# Patient Record
Sex: Male | Born: 1960 | Race: White | Hispanic: No | Marital: Married | Smoking: Never smoker
Health system: Southern US, Community
[De-identification: ages and names within clinical notes are randomized; demographics above are authoritative.]

## PROBLEM LIST (undated history)

## (undated) DIAGNOSIS — C801 Malignant (primary) neoplasm, unspecified: Secondary | ICD-10-CM

## (undated) DIAGNOSIS — J9819 Other pulmonary collapse: Secondary | ICD-10-CM

## (undated) DIAGNOSIS — K219 Gastro-esophageal reflux disease without esophagitis: Secondary | ICD-10-CM

## (undated) DIAGNOSIS — M720 Palmar fascial fibromatosis [Dupuytren]: Secondary | ICD-10-CM

## (undated) DIAGNOSIS — K1379 Other lesions of oral mucosa: Secondary | ICD-10-CM

---

## 1983-11-10 HISTORY — PX: KNEE ARTHROSCOPY: SUR90

## 1995-11-10 DIAGNOSIS — J9819 Other pulmonary collapse: Secondary | ICD-10-CM

## 1995-11-10 HISTORY — DX: Other pulmonary collapse: J98.19

## 2004-03-25 ENCOUNTER — Emergency Department (HOSPITAL_COMMUNITY): Admission: EM | Admit: 2004-03-25 | Discharge: 2004-03-25 | Payer: Self-pay | Admitting: Family Medicine

## 2004-05-30 ENCOUNTER — Encounter: Admission: RE | Admit: 2004-05-30 | Discharge: 2004-05-30 | Payer: Self-pay | Admitting: Orthopedic Surgery

## 2004-06-18 ENCOUNTER — Encounter: Admission: RE | Admit: 2004-06-18 | Discharge: 2004-06-18 | Payer: Self-pay | Admitting: Orthopedic Surgery

## 2004-07-08 ENCOUNTER — Encounter: Admission: RE | Admit: 2004-07-08 | Discharge: 2004-07-08 | Payer: Self-pay | Admitting: Orthopedic Surgery

## 2004-07-22 ENCOUNTER — Encounter: Admission: RE | Admit: 2004-07-22 | Discharge: 2004-07-22 | Payer: Self-pay | Admitting: Orthopedic Surgery

## 2004-09-02 ENCOUNTER — Ambulatory Visit (HOSPITAL_COMMUNITY): Admission: RE | Admit: 2004-09-02 | Discharge: 2004-09-02 | Payer: Self-pay | Admitting: Neurosurgery

## 2004-10-10 ENCOUNTER — Encounter: Admission: RE | Admit: 2004-10-10 | Discharge: 2004-10-10 | Payer: Self-pay | Admitting: General Surgery

## 2005-11-09 HISTORY — PX: OTHER SURGICAL HISTORY: SHX169

## 2008-08-11 ENCOUNTER — Emergency Department (HOSPITAL_BASED_OUTPATIENT_CLINIC_OR_DEPARTMENT_OTHER): Admission: EM | Admit: 2008-08-11 | Discharge: 2008-08-12 | Payer: Self-pay | Admitting: Emergency Medicine

## 2008-08-17 ENCOUNTER — Emergency Department (HOSPITAL_COMMUNITY): Admission: EM | Admit: 2008-08-17 | Discharge: 2008-08-17 | Payer: Self-pay | Admitting: Emergency Medicine

## 2008-11-20 ENCOUNTER — Ambulatory Visit (HOSPITAL_COMMUNITY): Admission: RE | Admit: 2008-11-20 | Discharge: 2008-11-20 | Payer: Self-pay | Admitting: Neurosurgery

## 2008-11-23 ENCOUNTER — Encounter: Admission: RE | Admit: 2008-11-23 | Discharge: 2008-11-23 | Payer: Self-pay | Admitting: Neurosurgery

## 2009-01-18 ENCOUNTER — Inpatient Hospital Stay (HOSPITAL_COMMUNITY): Admission: RE | Admit: 2009-01-18 | Discharge: 2009-01-21 | Payer: Self-pay | Admitting: Neurosurgery

## 2009-11-09 HISTORY — PX: LUMBAR FUSION: SHX111

## 2010-05-23 ENCOUNTER — Emergency Department (HOSPITAL_COMMUNITY): Admission: EM | Admit: 2010-05-23 | Discharge: 2010-05-23 | Payer: Self-pay | Admitting: Emergency Medicine

## 2011-01-24 LAB — BASIC METABOLIC PANEL
BUN: 12 mg/dL (ref 6–23)
CO2: 25 mEq/L (ref 19–32)
Calcium: 8.9 mg/dL (ref 8.4–10.5)
Chloride: 106 mEq/L (ref 96–112)
Creatinine, Ser: 1.27 mg/dL (ref 0.4–1.5)
GFR calc Af Amer: >60 mL/min (ref >60)
GFR calc non Af Amer: >60 mL/min (ref >60)
Glucose, Bld: 141 mg/dL — ABNORMAL HIGH (ref 70–99)
Potassium: 3.8 mEq/L (ref 3.5–5.1)
Sodium: 139 mEq/L (ref 135–145)

## 2011-01-24 LAB — CBC
HCT: 45.5 % (ref 39.0–52.0)
Hemoglobin: 16 g/dL (ref 13.0–17.0)
MCH: 31.3 pg (ref 26.0–34.0)
MCHC: 35.1 g/dL (ref 30.0–36.0)
MCV: 89.3 fL (ref 78.0–100.0)
Platelets: 175 10*3/uL (ref 150–400)
RBC: 5.1 MIL/uL (ref 4.22–5.81)
RDW: 13.3 % (ref 11.5–15.5)
WBC: 8.7 10*3/uL (ref 4.0–10.5)

## 2011-01-24 LAB — URINALYSIS, ROUTINE W REFLEX MICROSCOPIC
Bilirubin Urine: NEGATIVE
Glucose, UA: NEGATIVE mg/dL
Hgb urine dipstick: NEGATIVE
Ketones, ur: NEGATIVE mg/dL
Nitrite: NEGATIVE
Protein, ur: NEGATIVE mg/dL
Specific Gravity, Urine: 1.023 (ref 1.005–1.030)
Urobilinogen, UA: 0.2 mg/dL (ref 0.0–1.0)
pH: 6.5 (ref 5.0–8.0)

## 2011-01-24 LAB — DIFFERENTIAL
Basophils Absolute: 0 10*3/uL (ref 0.0–0.1)
Basophils Relative: 0 % (ref 0–1)
Eosinophils Absolute: 0.1 10*3/uL (ref 0.0–0.7)
Eosinophils Relative: 1 % (ref 0–5)
Lymphocytes Relative: 9 % — ABNORMAL LOW (ref 12–46)
Lymphs Abs: 0.8 10*3/uL (ref 0.7–4.0)
Monocytes Absolute: 0.4 10*3/uL (ref 0.1–1.0)
Monocytes Relative: 4 % (ref 3–12)
Neutro Abs: 7.5 10*3/uL (ref 1.7–7.7)
Neutrophils Relative %: 86 % — ABNORMAL HIGH (ref 43–77)

## 2011-02-19 LAB — CBC
HCT: 44.7 % (ref 39.0–52.0)
Hemoglobin: 15.7 g/dL (ref 13.0–17.0)
MCHC: 35 g/dL (ref 30.0–36.0)
MCV: 90.2 fL (ref 78.0–100.0)
Platelets: 209 10*3/uL (ref 150–400)
RBC: 4.96 MIL/uL (ref 4.22–5.81)
RDW: 13.4 % (ref 11.5–15.5)
WBC: 7.9 10*3/uL (ref 4.0–10.5)

## 2011-02-19 LAB — COMPREHENSIVE METABOLIC PANEL
ALT: 31 U/L (ref 0–53)
Albumin: 4 g/dL (ref 3.5–5.2)
Calcium: 9.4 mg/dL (ref 8.4–10.5)
GFR calc Af Amer: >60 mL/min (ref >60)
Glucose, Bld: 97 mg/dL (ref 70–99)
Sodium: 140 mEq/L (ref 135–145)
Total Protein: 6.8 g/dL (ref 6.0–8.3)

## 2011-02-19 LAB — DIFFERENTIAL
Eosinophils Absolute: 0.1 10*3/uL (ref 0.0–0.7)
Lymphs Abs: 1.4 10*3/uL (ref 0.7–4.0)
Monocytes Relative: 9 % (ref 3–12)
Neutrophils Relative %: 71 % (ref 43–77)

## 2011-02-19 LAB — TYPE AND SCREEN
ABO/RH(D): A POS
Antibody Screen: NEGATIVE

## 2011-02-19 LAB — APTT: aPTT: 28 seconds (ref 24–37)

## 2011-02-19 LAB — PROTIME-INR: INR: 1 (ref 0.00–1.49)

## 2011-02-19 LAB — URINALYSIS, ROUTINE W REFLEX MICROSCOPIC
Hgb urine dipstick: NEGATIVE
Nitrite: NEGATIVE
Specific Gravity, Urine: 1.027 (ref 1.005–1.030)
Urobilinogen, UA: 1 mg/dL (ref 0.0–1.0)

## 2011-03-24 NOTE — Op Note (Signed)
[name], Douglas             ACCOUNT NO.:  [id number]   MEDICAL RECORD NO.:  [id number]          PATIENT TYPE:  INP   LOCATION:  3034                         FACILITY:  [facility]   PHYSICIAN:  Payton Doughty, M.D.      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   OPERATIVE PROCEDURE:  L3-4 extreme lateral interbody fusion.   ANESTHESIA:  General endotracheal.   PREPARATION:  Prepped and draped with alcohol wipe.   COMPLICATIONS:  None.   NURSE ASSISTANT:  Bedelia Person, MD   DOCTOR ASSISTANT:  Hilda Lias, M.D.   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  Taken to the operating room, smoothly anesthetized, intubated, and  placed in the left side up and right side down lateral decubitus  position and prepared for EMG monitoring.  Following shave, prepped and  draped in the usual sterile fashion after C-arm localization, skin was  incised directly lateral from the C3-4 disk space and then slightly  posterior to that, using the 2 incision techniques, retroperitoneal  space was accessed and the probing wand was placed down to the  iliopsoas, for which he tested positive and the monitoring of the lumbar  plexus was carried out with advancing dilators over the L3-L4 disk  space.  Finally, the retractor was seated, the Shim placed and the  retractor opened.  C-arm fluoroscopy demonstrated good placement of  retractor in 2 planes.  Diskectomy was carried out and lateral release  was obtained.  A 10 x 55-mm interbody device filled with bone putty was  tapped into place after preparing the endplates.  Screws were placed and  lateral plate was attached to them and locked down.  Fluoroscopy  demonstrated good placement of interbody device as well as screws.  Incisions were  closed with 0 Vicryl, 2-0 Vicryl, and 3-0 nylon.  Betadine and Telfa dressing was applied and made occlusive with OpSite,  and the patient returned to recovery room in good condition.           ______________________________  Payton Doughty, M.D.     MWR/MEDQ  D:  01/18/2009  T:  01/19/2009  Job:  [id number]
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
## 2011-11-10 HISTORY — PX: MOHS SURGERY: SHX181

## 2012-05-07 ENCOUNTER — Emergency Department (HOSPITAL_COMMUNITY): Payer: BC Managed Care – PPO

## 2012-05-07 ENCOUNTER — Encounter (HOSPITAL_COMMUNITY): Payer: Self-pay | Admitting: *Deleted

## 2012-05-07 ENCOUNTER — Emergency Department (INDEPENDENT_AMBULATORY_CARE_PROVIDER_SITE_OTHER): Payer: BC Managed Care – PPO

## 2012-05-07 ENCOUNTER — Emergency Department (HOSPITAL_COMMUNITY)
Admission: EM | Admit: 2012-05-07 | Discharge: 2012-05-07 | Disposition: A | Discharge status: Nursing Home (Includes ICF) | Payer: BC Managed Care – PPO | Source: Home / Self Care | Attending: Emergency Medicine | Admitting: Emergency Medicine

## 2012-05-07 ENCOUNTER — Emergency Department (HOSPITAL_COMMUNITY)
Admission: EM | Admit: 2012-05-07 | Discharge: 2012-05-08 | Disposition: A | Discharge status: Home / Self Care | Payer: BC Managed Care – PPO | Attending: Emergency Medicine | Admitting: Emergency Medicine

## 2012-05-07 DIAGNOSIS — N200 Calculus of kidney: Secondary | ICD-10-CM

## 2012-05-07 DIAGNOSIS — D72829 Elevated white blood cell count, unspecified: Secondary | ICD-10-CM | POA: Insufficient documentation

## 2012-05-07 DIAGNOSIS — R109 Unspecified abdominal pain: Secondary | ICD-10-CM | POA: Insufficient documentation

## 2012-05-07 DIAGNOSIS — G8929 Other chronic pain: Secondary | ICD-10-CM | POA: Insufficient documentation

## 2012-05-07 DIAGNOSIS — M549 Dorsalgia, unspecified: Secondary | ICD-10-CM | POA: Insufficient documentation

## 2012-05-07 DIAGNOSIS — N10 Acute tubulo-interstitial nephritis: Secondary | ICD-10-CM

## 2012-05-07 LAB — COMPREHENSIVE METABOLIC PANEL
Albumin: 4 g/dL (ref 3.5–5.2)
Alkaline Phosphatase: 82 U/L (ref 39–117)
BUN: 12 mg/dL (ref 6–23)
Calcium: 9.2 mg/dL (ref 8.4–10.5)
Potassium: 4.5 mEq/L (ref 3.5–5.1)
Sodium: 142 mEq/L (ref 135–145)
Total Protein: 6.4 g/dL (ref 6.0–8.3)

## 2012-05-07 LAB — CBC WITH DIFFERENTIAL/PLATELET
Basophils Relative: 0 % (ref 0–1)
Eosinophils Absolute: 0.1 10*3/uL (ref 0.0–0.7)
MCH: 30.4 pg (ref 26.0–34.0)
MCHC: 35.1 g/dL (ref 30.0–36.0)
Monocytes Relative: 4 % (ref 3–12)
Neutrophils Relative %: 88 % — ABNORMAL HIGH (ref 43–77)
Platelets: 207 10*3/uL (ref 150–400)

## 2012-05-07 LAB — LIPASE, BLOOD: Lipase: 33 U/L (ref 11–59)

## 2012-05-07 LAB — POCT URINALYSIS DIP (DEVICE)
Hgb urine dipstick: NEGATIVE
Nitrite: NEGATIVE
Urobilinogen, UA: 1 mg/dL (ref 0.0–1.0)
pH: 7 (ref 5.0–8.0)

## 2012-05-07 MED ORDER — ONDANSETRON HCL 4 MG/2ML IJ SOLN
INTRAMUSCULAR | Status: AC
  Filled 2012-05-07: qty 2

## 2012-05-07 MED ORDER — KETOROLAC TROMETHAMINE 60 MG/2ML IM SOLN
60.0000 mg | Freq: Once | INTRAMUSCULAR | Status: AC
  Administered 2012-05-07: 60 mg via INTRAMUSCULAR

## 2012-05-07 MED ORDER — HYDROMORPHONE HCL PF 1 MG/ML IJ SOLN
INTRAMUSCULAR | Status: AC
  Filled 2012-05-07: qty 2

## 2012-05-07 MED ORDER — KETOROLAC TROMETHAMINE 60 MG/2ML IM SOLN
INTRAMUSCULAR | Status: AC
  Filled 2012-05-07: qty 2

## 2012-05-07 MED ORDER — HYDROMORPHONE HCL PF 1 MG/ML IJ SOLN
2.0000 mg | Freq: Once | INTRAMUSCULAR | Status: AC
  Administered 2012-05-07: 2 mg via INTRAMUSCULAR

## 2012-05-07 MED ORDER — HYDROMORPHONE HCL PF 1 MG/ML IJ SOLN
1.0000 mg | Freq: Once | INTRAMUSCULAR | Status: AC
  Administered 2012-05-07: 1 mg via INTRAVENOUS
  Filled 2012-05-07: qty 1

## 2012-05-07 MED ORDER — SODIUM CHLORIDE 0.9 % IV BOLUS (SEPSIS)
1000.0000 mL | Freq: Once | INTRAVENOUS | Status: AC
  Administered 2012-05-07: 1000 mL via INTRAVENOUS

## 2012-05-07 MED ORDER — ONDANSETRON HCL 4 MG/2ML IJ SOLN
4.0000 mg | Freq: Once | INTRAMUSCULAR | Status: AC
  Administered 2012-05-07: 4 mg via INTRAVENOUS
  Filled 2012-05-07: qty 2

## 2012-05-07 MED ORDER — ONDANSETRON HCL 4 MG/2ML IJ SOLN
4.0000 mg | Freq: Once | INTRAMUSCULAR | Status: AC
  Administered 2012-05-07: 4 mg via INTRAMUSCULAR

## 2012-05-07 NOTE — ED Notes (Addendum)
Pt with onset of left lower back pain radiating to left lower abd - denies urinary symptoms - left lower back tender to palpation - c/o nausea denies vomiting - pt with history back surgery - has implanted bone stimulator left buttock

## 2012-05-07 NOTE — ED Provider Notes (Signed)
Chief Complaint  Patient presents with  . Abdominal Pain  . Back Pain  . Nausea    History of Present Illness:    Douglas [name] is a 51 year old male who experienced sudden onset of severe 10 over 10 left flank pain this morning. The pain got worse as the day progressed and radiated towards his left groin. This was sharp and associated with nausea, chills, and diaphoresis he did not have any fever, vomiting, diarrhea, or blood in the stool. He was somewhat constipated but took an enema without relief. He denies any urinary symptoms such as dysuria, frequency, urgency, or blood in the urine. He does have a history of a possible kidney stone many past years ago. No history of diverticulitis or kidney infections.  Review of Systems:  Other than noted above, the patient denies any of the following symptoms: Constitutional:  No fever, chills, fatigue, weight loss or anorexia. Lungs:  No cough or shortness of breath. Heart:  No chest pain, palpitations, syncope or edema.  No cardiac history. Abdomen:  No nausea, vomiting, hematememesis, melena, diarrhea, or hematochezia. GU:  No dysuria, frequency, urgency, or hematuria.  No testicular pain or swelling. Skin:  No rash or itching.  PMFSH:  Past medical history, family history, social history, meds, and allergies were reviewed along with nurse's notes.  No prior abdominal surgeries or history of GI problems.  No use of NSAIDs or aspirin.  No excessive  alcohol intake.  Physical Exam:   Vital signs:  BP 146/100  Pulse 80  Temp 98.2 F (36.8 C) (Oral)  Resp 20  SpO2 98% Gen:  Alert, oriented, in severe distress due to his pain. Lungs:  Breath sounds clear and equal bilaterally.  No wheezes, rales or rhonchi. Heart:  Regular rhythm.  No gallops or murmers.   Abdomen:  Abdomen was soft, flat, nondistended. No organomegaly or mass. He has mild pain to palpation in the left flank, left upper quadrant, and left lower quadrant without guarding or rebound.  Bowel sounds were not heard. Back: He has severe left CVA tenderness to percussion. Skin:  Clear, warm and dry.  No rash.  Labs:   Results for orders placed during the hospital encounter of 05/07/12  POCT URINALYSIS DIP (DEVICE)      Component Value Range   Glucose, UA NEGATIVE  NEGATIVE mg/dL   Bilirubin Urine NEGATIVE  NEGATIVE   Ketones, ur NEGATIVE  NEGATIVE mg/dL   Specific Gravity, Urine 1.020  1.005 - 1.030   Hgb urine dipstick NEGATIVE  NEGATIVE   pH 7.0  5.0 - 8.0   Protein, ur NEGATIVE  NEGATIVE mg/dL   Urobilinogen, UA 1.0  0.0 - 1.0 mg/dL   Nitrite NEGATIVE  NEGATIVE   Leukocytes, UA NEGATIVE  NEGATIVE  CBC WITH DIFFERENTIAL      Component Value Range   WBC 12.7 (*) 4.0 - 10.5 K/uL   RBC 4.90  4.22 - 5.81 MIL/uL   Hemoglobin 14.9  13.0 - 17.0 g/dL   HCT 40.9  81.1 - 91.4 %   MCV 86.5  78.0 - 100.0 fL   MCH 30.4  26.0 - 34.0 pg   MCHC 35.1  30.0 - 36.0 g/dL   RDW 78.2  95.6 - 21.3 %   Platelets 207  150 - 400 K/uL   Neutrophils Relative 88 (*) 43 - 77 %   Neutro Abs 11.1 (*) 1.7 - 7.7 K/uL   Lymphocytes Relative 8 (*) 12 - 46 %   Lymphs  Abs 1.1  0.7 - 4.0 K/uL   Monocytes Relative 4  3 - 12 %   Monocytes Absolute 0.5  0.1 - 1.0 K/uL   Eosinophils Relative 1  0 - 5 %   Eosinophils Absolute 0.1  0.0 - 0.7 K/uL   Basophils Relative 0  0 - 1 %   Basophils Absolute 0.0  0.0 - 0.1 K/uL     Radiology:  Dg Abd 1 View  05/07/2012  *RADIOLOGY REPORT*  Clinical Data: Left flank pain  ABDOMEN - 1 VIEW  Comparison: CT 05/23/2010  Findings: Gas pattern is unremarkable.  No evidence of urinary tract calcification.  There is a phlebolith in the left pelvis. Previous lateral fusion at L3-4.  Neurostimulator projects over the region.  IMPRESSION: No acute finding.  No evidence of urinary tract stone disease.  Original Report Authenticated By: Thomasenia Sales, M.D.   Course in Urgent Care Center:   He was given Dilaudid 2 mg IM, Zofran 4 mg IM, and Toradol 60 mg IM which he  tolerated well without any immediate side effects. He got a little bit of relief, but his pain was not gone.  Assessment:  The primary encounter diagnosis was Kidney stone. A diagnosis of Acute pyelonephritis was also pertinent to this visit. At this point the differential diagnosis would include kidney stone, kidney stone with infection, or acute pyelonephritis, or other infectious causes such as diverticulitis.  Plan:   1.  The following meds were prescribed:   New Prescriptions   No medications on file   2.  The patient was transported to the emergency department via shuttle.  Reuben Likes, MD 05/07/12 2028

## 2012-05-07 NOTE — ED Notes (Signed)
Pt states that this afternoon he had back pain at first then the pain wrapped around his left side. Pt from [facility]. Pt was given pain medications which have helped and has urine and blood work already resulted from urgent care.

## 2012-05-07 NOTE — Discharge Instructions (Signed)
We have determined that your problem requires further evaluation in the emergency department.  We will take care of your transport there.  Once at the emergency department, you will be evaluated by a provider and they will order whatever treatment or tests they deem necessary.  We cannot guarantee that they will do any specific test or do any specific treatment.    Kidney Stones Kidney stones (ureteral lithiasis) are deposits that form inside your kidneys. The intense pain is caused by the stone moving through the urinary tract. When the stone moves, the ureter goes into spasm around the stone. The stone is usually passed in the urine.  CAUSES   A disorder that makes certain neck glands produce too much parathyroid hormone (primary hyperparathyroidism).   A buildup of uric acid crystals.   Narrowing (stricture) of the ureter.   A kidney obstruction present at birth (congenital obstruction).   Previous surgery on the kidney or ureters.   Numerous kidney infections.  SYMPTOMS   Feeling sick to your stomach (nauseous).   Throwing up (vomiting).   Blood in the urine (hematuria).   Pain that usually spreads (radiates) to the groin.   Frequency or urgency of urination.  DIAGNOSIS   Taking a history and physical exam.   Blood or urine tests.   Computerized X-ray scan (CT scan).   Occasionally, an examination of the inside of the urinary bladder (cystoscopy) is performed.  TREATMENT   Observation.   Increasing your fluid intake.   Surgery may be needed if you have severe pain or persistent obstruction.  The size, location, and chemical composition are all important variables that will determine the proper choice of action for you. Talk to your caregiver to better understand your situation so that you will minimize the risk of injury to yourself and your kidney.  HOME CARE INSTRUCTIONS   Drink enough water and fluids to keep your urine clear or pale yellow.   Strain all urine  through the provided strainer. Keep all particulate matter and stones for your caregiver to see. The stone causing the pain may be as small as a grain of salt. It is very important to use the strainer each and every time you pass your urine. The collection of your stone will allow your caregiver to analyze it and verify that a stone has actually passed.   Only take over-the-counter or prescription medicines for pain, discomfort, or fever as directed by your caregiver.   Make a follow-up appointment with your caregiver as directed.   Get follow-up X-rays if required. The absence of pain does not always mean that the stone has passed. It may have only stopped moving. If the urine remains completely obstructed, it can cause loss of kidney function or even complete destruction of the kidney. It is your responsibility to make sure X-rays and follow-ups are completed. Ultrasounds of the kidney can show blockages and the status of the kidney. Ultrasounds are not associated with any radiation and can be performed easily in a matter of minutes.  SEEK IMMEDIATE MEDICAL CARE IF:   Pain cannot be controlled with the prescribed medicine.   You have a fever.   The severity or intensity of pain increases over 18 hours and is not relieved by pain medicine.   You develop a new onset of abdominal pain.   You feel faint or pass out.  MAKE SURE YOU:   Understand these instructions.   Will watch your condition.   Will get help  right away if you are not doing well or get worse.  Document Released: 10/26/2005 Document Revised: 10/15/2011 Document Reviewed: 02/21/2010 [facility] Patient Information 2012 [place], [place].

## 2012-05-07 NOTE — ED Provider Notes (Signed)
History     CSN: [id number]  Arrival date & time 05/07/12  2029   First MD Initiated Contact with Patient 05/07/12 2100      Chief Complaint  Patient presents with  . Abdominal Pain    (Consider location/radiation/quality/duration/timing/severity/associated sxs/prior treatment) HPI  Pw left flank pain since this morning. Rates 6/10 at this time, improved after pain medication at [facility] pta. +Nausea without vomiting. Reports mild constipation, last BM this morning, has been using enemas. No diarrhea. No blood in stool. Denies hematuria/dysuria/freq/urgency. Denies fever +chills. No h/o nephrolithiasis. Denies history of recent trauma/falls. Denies h/o malignancy, DM, immunocompromise  injection drug use, immunosuppression, indwelling urinary catheter, prolonged steroid use, skin or urinary tract infection. No numbness/tingling/weakness of extremities. Denies fever/chills. Denies saddle anesthesia, no urinary incontinence or retention.   No h/o abdominal surgeries. H/o chronic back pain s/p spinal cord stimulator- back pain at baseline   ED Notes, ED Provider Notes from 05/07/12 0000 to 05/07/12 20:38:41       Douglas Carmell Austria, RN 05/07/2012 20:36      Pt states that this afternoon he had back pain at first then the pain wrapped around his left side. Pt from [facility]. Pt was given pain medications which have helped and has urine and blood work already resulted from urgent care.     History reviewed. No pertinent past medical history.  Past Surgical History  Procedure Date  . Back surgery   . Lumbar fusion   . Implanted bone stimulator     History reviewed. No pertinent family history.  History  Substance Use Topics  . Smoking status: Never Smoker   . Smokeless tobacco: Not on file  . Alcohol Use: Yes    Review of Systems  All other systems reviewed and are negative.  except as noted HPI   Allergies  Review of patient's allergies indicates no known allergies.  Home  Medications   Current Outpatient Rx  Name Route Sig Dispense Refill  . OXYCODONE-ACETAMINOPHEN 5-325 MG PO TABS Oral Take 1 tablet by mouth every 4 (four) hours as needed for pain. 15 tablet 0    BP 129/85  Pulse 67  Temp 98.7 F (37.1 C) (Oral)  Resp 16  SpO2 98%  Physical Exam  Nursing note and vitals reviewed. Constitutional: He is oriented to person, place, and time. He appears well-developed and well-nourished. No distress.  HENT:  Head: Atraumatic.  Mouth/Throat: Oropharynx is clear and moist.  Eyes: Conjunctivae are normal. Pupils are equal, round, and reactive to light.  Neck: Neck supple.  Cardiovascular: Normal rate, regular rhythm, normal heart sounds and intact distal pulses.  Exam reveals no gallop and no friction rub.   No murmur heard. Pulmonary/Chest: Effort normal. No respiratory distress. He has no wheezes. He has no rales.  Abdominal: Soft. Bowel sounds are normal. There is tenderness. There is no rebound and no guarding.       Min LLQ ttp No CVAT  Genitourinary: Penis normal.       No scrotal ttp/erythema/swelling  Musculoskeletal: Normal range of motion. He exhibits no edema and no tenderness.  Neurological: He is alert and oriented to person, place, and time.  Skin: Skin is warm and dry.  Psychiatric: He has a normal mood and affect.    ED Course  Procedures (including critical care time)  Labs Reviewed  COMPREHENSIVE METABOLIC PANEL - Abnormal; Notable for the following:    Glucose, Bld 121 (*)     GFR calc non Af  Amer 77 (*)     GFR calc Af Amer 89 (*)     All other components within normal limits  LIPASE, BLOOD  LAB REPORT - SCANNED   Ct Abdomen Pelvis Wo Contrast  05/07/2012  *RADIOLOGY REPORT*  Clinical Data: Left flank pain  CT ABDOMEN AND PELVIS WITHOUT CONTRAST  Technique:  Multidetector CT imaging of the abdomen and pelvis was performed following the standard protocol without intravenous contrast.  Comparison: 05/23/2010  Findings: Mild  dependent atelectasis.  Normal heart size.  No pleural or pericardial effusion.  Organ abnormality/lesion detection is limited in the absence of intravenous contrast. Within this limitation, unremarkable liver, biliary system, spleen, pancreas, adrenal glands.  Symmetric renal size.  No hydronephrosis or hydroureter.  No urinary tract calculi identified.  No bowel obstruction.  No CT evidence for colitis.  Normal appendix.  No free intraperitoneal air or fluid.  No lymphadenopathy.  Nonspecific mild bladder wall thickening.  Mild prostatomegaly.  Left posterior subcutaneous battery pack with neurostimulator entering at the T12/L1 level and coursing superiorly. Postoperative changes at L3-4 post lateral fusion.  IMPRESSION: No hydronephrosis or urinary tract calculi identified.  Mild bladder wall thickening is nonspecific given incomplete distension.  Correlate with urinalysis.  Original Report Authenticated By: Waneta Martins, M.D.   Dg Abd 1 View  05/07/2012  *RADIOLOGY REPORT*  Clinical Data: Left flank pain  ABDOMEN - 1 VIEW  Comparison: CT 05/23/2010  Findings: Gas pattern is unremarkable.  No evidence of urinary tract calcification.  There is a phlebolith in the left pelvis. Previous lateral fusion at L3-4.  Neurostimulator projects over the region.  IMPRESSION: No acute finding.  No evidence of urinary tract stone disease.  Original Report Authenticated By: Thomasenia Sales, M.D.    1. Abdominal  pain, other specified site   2. Leukocytosis   3. Flank pain    MDM  Left flank pain. UA unremarkable from [facility]. CBC with mild leukocytosis. CT A/P without renal stone, no evidence for colitis on noncontrast scan. His chronic back pain is at baseline. Pain controlled in ED. Advised to f/u with PMD if sx persist. No EMC precluding discharge at this time. Given Precautions for return. PMD f/u.         Forbes Cellar, MD 05/08/12 1530

## 2012-05-08 MED ORDER — OXYCODONE-ACETAMINOPHEN 5-325 MG PO TABS: 1.0000 | ORAL_TABLET | ORAL | Status: AC | PRN

## 2012-05-08 MED ORDER — OXYCODONE-ACETAMINOPHEN 5-325 MG PO TABS
1.0000 | ORAL_TABLET | Freq: Once | ORAL | Status: AC
  Administered 2012-05-08: 1 via ORAL
  Filled 2012-05-08: qty 1

## 2012-05-08 NOTE — Discharge Instructions (Signed)
Abdominal Pain Abdominal pain can be caused by many things. Your caregiver decides the seriousness of your pain by an examination and possibly blood tests and X-rays. Many cases can be observed and treated at home. Most abdominal pain is not caused by a disease and will probably improve without treatment. However, in many cases, more time must pass before a clear cause of the pain can be found. Before that point, it may not be known if you need more testing, or if hospitalization or surgery is needed. HOME CARE INSTRUCTIONS   Do not take laxatives unless directed by your caregiver.   Take pain medicine only as directed by your caregiver.   Only take over-the-counter or prescription medicines for pain, discomfort, or fever as directed by your caregiver.   Try a clear liquid diet (broth, tea, or water) for as long as directed by your caregiver. Slowly move to a bland diet as tolerated.  SEEK IMMEDIATE MEDICAL CARE IF:   The pain does not go away.   You have a fever.   You keep throwing up (vomiting).   The pain is felt only in portions of the abdomen. Pain in the right side could possibly be appendicitis. In an adult, pain in the left lower portion of the abdomen could be colitis or diverticulitis.   You pass bloody or black tarry stools.  MAKE SURE YOU:   Understand these instructions.   Will watch your condition.   Will get help right away if you are not doing well or get worse.  Document Released: 08/05/2005 Document Revised: 10/15/2011 Document Reviewed: 06/13/2008 ExitCare Patient Information 2012 [employer].  RESOURCE GUIDE  Dental Problems  Patients with Medicaid: [facility]                     [facility] [address]                                           [address] Phone:  [phone]                                                   Phone:  [phone]  If unable to pay or uninsured, contact:  [facility] or [place] [facility] to become qualified for the adult dental clinic.  Chronic Pain Problems Contact  [facility]  [phone] Patients need to be referred by their primary care doctor.  Insufficient Money for Medicine Contact United Way:  call "211" or [facility] 271-5999.  No Primary Care Doctor Call [facility]  832-8000 Other agencies that provide inexpensive medical care    [facility]  [phone]    [facility]  [phone]    [facility]  [phone]    [facility] Clinic  [phone]    [facility]  [phone]    [facility]  [phone]  Psychological Services [facility]  [phone] [facility]  [phone] [facility]   800 853-5163 (emergency services 641-4993)  Abuse/Neglect [place] Child Abuse Hotline (336) 641-3795 [place] Child Abuse Hotline 800-378-5315 (After Hours)  Emergency Shelter  [facility] (336) 271-5985  Maternity Homes   [facility] (336) 275-9566 [facility] (704) 372-4663  MRSA Hotline #:   832-7006    [place] [facility]  [facility] [place]  [employer]                           [facility] [facility] [address] [place]                     [address]         [address]                                                 [place]                              [place] Phone:  [phone]                                  Phone:  [phone]                   Phone:  [phone]  [facility] Phone:  [phone]  [place] Child Abuse Hotline (336) 342-1394 (336) 342-3537 (After Hours)  

## 2014-02-21 ENCOUNTER — Other Ambulatory Visit: Payer: Self-pay | Admitting: Family Medicine

## 2014-02-21 DIAGNOSIS — M7989 Other specified soft tissue disorders: Secondary | ICD-10-CM

## 2014-02-22 ENCOUNTER — Ambulatory Visit
Admission: RE | Admit: 2014-02-22 | Discharge: 2014-02-22 | Disposition: A | Discharge status: Home / Self Care | Payer: BC Managed Care – PPO | Source: Ambulatory Visit | Attending: Family Medicine | Admitting: Family Medicine

## 2014-02-22 DIAGNOSIS — M7989 Other specified soft tissue disorders: Secondary | ICD-10-CM

## 2014-02-27 ENCOUNTER — Other Ambulatory Visit: Payer: Self-pay | Admitting: Family Medicine

## 2014-02-28 ENCOUNTER — Other Ambulatory Visit: Payer: Self-pay | Admitting: Family Medicine

## 2014-02-28 DIAGNOSIS — M7989 Other specified soft tissue disorders: Secondary | ICD-10-CM

## 2014-03-05 ENCOUNTER — Ambulatory Visit
Admission: RE | Admit: 2014-03-05 | Discharge: 2014-03-05 | Disposition: A | Discharge status: Home / Self Care | Payer: BC Managed Care – PPO | Source: Ambulatory Visit | Attending: Family Medicine | Admitting: Family Medicine

## 2014-03-05 DIAGNOSIS — M7989 Other specified soft tissue disorders: Secondary | ICD-10-CM

## 2014-03-05 MED ORDER — IOHEXOL 300 MG/ML  SOLN
75.0000 mL | Freq: Once | INTRAMUSCULAR | Status: AC | PRN
  Administered 2014-03-05: 75 mL via INTRAVENOUS

## 2014-03-08 ENCOUNTER — Other Ambulatory Visit: Payer: Self-pay | Admitting: Family Medicine

## 2014-03-08 DIAGNOSIS — E041 Nontoxic single thyroid nodule: Secondary | ICD-10-CM

## 2014-03-12 ENCOUNTER — Ambulatory Visit
Admission: RE | Admit: 2014-03-12 | Discharge: 2014-03-12 | Disposition: A | Discharge status: Home / Self Care | Payer: BC Managed Care – PPO | Source: Ambulatory Visit | Attending: Family Medicine | Admitting: Family Medicine

## 2014-03-12 DIAGNOSIS — E041 Nontoxic single thyroid nodule: Secondary | ICD-10-CM

## 2014-07-06 ENCOUNTER — Other Ambulatory Visit: Payer: Self-pay | Admitting: Internal Medicine

## 2014-07-06 DIAGNOSIS — E041 Nontoxic single thyroid nodule: Secondary | ICD-10-CM

## 2014-07-17 ENCOUNTER — Other Ambulatory Visit (HOSPITAL_COMMUNITY)
Admission: RE | Admit: 2014-07-17 | Discharge: 2014-07-17 | Disposition: A | Discharge status: Home / Self Care | Payer: BC Managed Care – PPO | Source: Ambulatory Visit | Attending: Interventional Radiology | Admitting: Interventional Radiology

## 2014-07-17 ENCOUNTER — Ambulatory Visit
Admission: RE | Admit: 2014-07-17 | Discharge: 2014-07-17 | Disposition: A | Discharge status: Home / Self Care | Payer: BC Managed Care – PPO | Source: Ambulatory Visit | Attending: Internal Medicine | Admitting: Internal Medicine

## 2014-07-17 DIAGNOSIS — E041 Nontoxic single thyroid nodule: Secondary | ICD-10-CM

## 2014-07-17 DIAGNOSIS — E079 Disorder of thyroid, unspecified: Secondary | ICD-10-CM | POA: Insufficient documentation

## 2014-08-07 ENCOUNTER — Other Ambulatory Visit (INDEPENDENT_AMBULATORY_CARE_PROVIDER_SITE_OTHER): Payer: Self-pay

## 2014-08-09 ENCOUNTER — Other Ambulatory Visit (INDEPENDENT_AMBULATORY_CARE_PROVIDER_SITE_OTHER): Payer: Self-pay | Admitting: Surgery

## 2014-08-17 ENCOUNTER — Other Ambulatory Visit (HOSPITAL_COMMUNITY): Payer: Self-pay | Admitting: Anesthesiology

## 2014-08-17 ENCOUNTER — Encounter (HOSPITAL_COMMUNITY): Payer: Self-pay | Admitting: Pharmacy Technician

## 2014-08-17 NOTE — Progress Notes (Signed)
Chest ct 03-05-14 epic

## 2014-08-17 NOTE — Patient Instructions (Addendum)
[place]  08/17/2014   Your procedure is scheduled on: Thursday October 15th, 2015  Report to [facility] and follow signs to  [place] at 1030 AM.  Call this number if you have problems the morning of surgery [phone]   Remember:  Do not eat food or drink liquids :After Midnight.                               You may not have any metal on your body including hair pins and piercings  Do not wear jewelry, make-up, lotions, powders, or deodorant.   Men may shave face and neck.  Do not bring valuables to the hospital. [place].  Contacts, dentures or bridgework may not be worn into surgery.  Leave suitcase in the car. After surgery it may be brought to your room.   ________________________________________________________________________  [facility] - Preparing for Surgery Before surgery, you can play an important role.  Because skin is not sterile, your skin needs to be as free of germs as possible.  You can reduce the number of germs on your skin by washing with CHG (chlorahexidine gluconate) soap before surgery.  CHG is an antiseptic cleaner which kills germs and bonds with the skin to continue killing germs even after washing. Please DO NOT use if you have an allergy to CHG or antibacterial soaps.  If your skin becomes reddened/irritated stop using the CHG and inform your nurse when you arrive at Short Stay. Do not shave (including legs and underarms) for at least 48 hours prior to the first CHG shower.  You may shave your face/neck. Please follow these instructions carefully:  1.  Shower with CHG Soap the night before surgery and the  morning of Surgery.  2.  If you choose to wash your hair, wash your hair first as usual with your  normal  shampoo.  3.  After you shampoo, rinse your hair and body thoroughly to remove the  shampoo.                            4.  Use CHG as you would any other  liquid soap.  You can apply chg directly  to the skin and wash                       Gently with a scrungie or clean washcloth.  5.  Apply the CHG Soap to your body ONLY FROM THE NECK DOWN.   Do not use on face/ open                           Wound or open sores. Avoid contact with eyes, ears mouth and genitals (private parts).                       Wash face,  Genitals (private parts) with your normal soap.             6.  Wash thoroughly, paying special attention to the area where your surgery  will be performed.  7.  Thoroughly rinse your body with warm water from the neck down.  8.  DO NOT shower/wash with your normal soap after using and rinsing off  the CHG Soap.  9.  Pat yourself dry with a clean towel.            10.  Wear clean pajamas.            11.  Place clean sheets on your bed the night of your first shower and do not  sleep with pets. Day of Surgery : Do not apply any lotions/deodorants the morning of surgery.  Please wear clean clothes to the hospital/surgery center.  FAILURE TO FOLLOW THESE INSTRUCTIONS MAY RESULT IN THE CANCELLATION OF YOUR SURGERY PATIENT SIGNATURE_________________________________  NURSE SIGNATURE__________________________________  ________________________________________________________________________

## 2014-08-20 ENCOUNTER — Encounter (INDEPENDENT_AMBULATORY_CARE_PROVIDER_SITE_OTHER): Payer: Self-pay

## 2014-08-20 ENCOUNTER — Encounter (HOSPITAL_COMMUNITY)
Admission: RE | Admit: 2014-08-20 | Discharge: 2014-08-20 | Disposition: A | Discharge status: Home / Self Care | Payer: BC Managed Care – PPO | Source: Ambulatory Visit | Attending: Surgery | Admitting: Surgery

## 2014-08-20 ENCOUNTER — Encounter (HOSPITAL_COMMUNITY): Payer: Self-pay

## 2014-08-20 DIAGNOSIS — Z85820 Personal history of malignant melanoma of skin: Secondary | ICD-10-CM | POA: Diagnosis not present

## 2014-08-20 DIAGNOSIS — C73 Malignant neoplasm of thyroid gland: Secondary | ICD-10-CM | POA: Diagnosis not present

## 2014-08-20 HISTORY — DX: Palmar fascial fibromatosis (dupuytren): M72.0

## 2014-08-20 HISTORY — DX: Other pulmonary collapse: J98.19

## 2014-08-20 HISTORY — DX: Gastro-esophageal reflux disease without esophagitis: K21.9

## 2014-08-20 LAB — CBC
HEMATOCRIT: 45 % (ref 39.0–52.0)
Hemoglobin: 15.7 g/dL (ref 13.0–17.0)
MCH: 30.4 pg (ref 26.0–34.0)
MCHC: 34.9 g/dL (ref 30.0–36.0)
MCV: 87.2 fL (ref 78.0–100.0)
PLATELETS: 183 10*3/uL (ref 150–400)
RBC: 5.16 MIL/uL (ref 4.22–5.81)
RDW: 13.1 % (ref 11.5–15.5)
WBC: 8.2 10*3/uL (ref 4.0–10.5)

## 2014-08-20 NOTE — Progress Notes (Signed)
Quick Note:  These results are acceptable for scheduled surgery.  Kalysta Kneisley M. Keelon Zurn, MD, FACS [facility] Office: [phone]   ______ 

## 2014-08-22 ENCOUNTER — Encounter (HOSPITAL_COMMUNITY): Payer: Self-pay | Admitting: Surgery

## 2014-08-22 DIAGNOSIS — D440 Neoplasm of uncertain behavior of thyroid gland: Secondary | ICD-10-CM | POA: Diagnosis present

## 2014-08-22 NOTE — Anesthesia Preprocedure Evaluation (Signed)
Anesthesia Evaluation  Patient identified by MRN, date of birth, ID band Patient awake    Reviewed: Allergy & Precautions, H&P , NPO status , Patient's Chart, lab work & pertinent test results  Airway Mallampati: II TM Distance: >3 FB Neck ROM: Full    Dental no notable dental hx.    Pulmonary neg pulmonary ROS,  breath sounds clear to auscultation  Pulmonary exam normal       Cardiovascular negative cardio ROS  Rhythm:Regular Rate:Normal     Neuro/Psych negative neurological ROS  negative psych ROS   GI/Hepatic Neg liver ROS, GERD-  Medicated and Controlled,  Endo/Other  negative endocrine ROS  Renal/GU negative Renal ROS  negative genitourinary   Musculoskeletal negative musculoskeletal ROS (+)   Abdominal   Peds negative pediatric ROS (+)  Hematology negative hematology ROS (+)   Anesthesia Other Findings   Reproductive/Obstetrics negative OB ROS                           Anesthesia Physical Anesthesia Plan  ASA: II  Anesthesia Plan: General   Post-op Pain Management:    Induction: Intravenous  Airway Management Planned: Oral ETT  Additional Equipment:   Intra-op Plan:   Post-operative Plan: Extubation in OR  Informed Consent: I have reviewed the patients History and Physical, chart, labs and discussed the procedure including the risks, benefits and alternatives for the proposed anesthesia with the patient or authorized representative who has indicated his/her understanding and acceptance.   Dental advisory given  Plan Discussed with: CRNA  Anesthesia Plan Comments:         Anesthesia Quick Evaluation

## 2014-08-22 NOTE — H&P (Signed)
General Surgery [facility]  Christena Deem. Baillie 08/09/2014 4:29 PM Location: [facility] Patient #: [id number] DOB: 1961-03-22 Married / Language: Cleophus Molt / Race: White Male  History of Present Illness [name] MD; 08/09/2014 5:46 PM) Patient words: thyroid f/u.  The patient is a 53 year old male who presents with a thyroid nodule. Patient is referred by Dr. Delrae Rend for evaluation of thyroid nodule with atypia.  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 Incidental finding was made of a thyroid nodule. In May 2015 the patient underwent a thyroid ultrasound showing a solitary solid nodule in the right thyroid lobe measuring 3.6 cm in size. Subsequent fine-needle aspiration biopsy was performed in September 2015. This shows a follicular lesion with cytologic atypia, class III, 3.6 cm in diameter. Patient is referred for consideration of right thyroidectomy for definitive diagnosis.  Patient has had no prior surgery on the head or neck. He has never been on thyroid medication. There is no family history of endocrine disease and specifically no history of endocrine neoplasms.  Patient describes mild compressive symptoms including chronic cough.   Other Problems Marjean Donna, CMA; 08/09/2014 4:29 PM) Back Pain Melanoma Thyroid Disease  Past Surgical History ([place]; 08/09/2014 4:29 PM) Knee Surgery Right. Spinal Surgery - Lower Back  Diagnostic Studies History Marjean Donna, CMA; 08/09/2014 4:29 PM) Colonoscopy 1-5 years ago  Allergies Marjean Donna, [place]; 08/09/2014 4:29 PM) No Known Drug Allergies10/11/2013  Medication History Marjean Donna, CMA; 08/09/2014 4:29 PM) No Current Medications  Social History Marjean Donna, [place]; 08/09/2014 4:29 PM) Alcohol use Occasional alcohol use. Caffeine use Carbonated beverages. No drug use Tobacco use Never smoker.  Family  History Marjean Donna, [place]; 08/09/2014 4:29 PM) Breast Cancer Family Members In General. Colon Polyps Mother. Melanoma Father.  Review of Systems ([place]; 08/09/2014 4:29 PM) General Present- Fatigue. Not Present- Appetite Loss, Chills, Fever, Night Sweats, Weight Gain and Weight Loss. Skin Not Present- Change in Wart/Mole, Dryness, Hives, Jaundice, New Lesions, Non-Healing Wounds, Rash and Ulcer. HEENT Present- Hoarseness. Not Present- Earache, Hearing Loss, Nose Bleed, Oral Ulcers, Ringing in the Ears, Seasonal Allergies, Sinus Pain, Sore Throat, Visual Disturbances, Wears glasses/contact lenses and Yellow Eyes. Respiratory Present- Chronic Cough, Difficulty Breathing and Wheezing. Not Present- Bloody sputum and Snoring. Breast Not Present- Breast Mass, Breast Pain, Nipple Discharge and Skin Changes. Cardiovascular Not Present- Chest Pain, Difficulty Breathing Lying Down, Leg Cramps, Palpitations, Rapid Heart Rate, Shortness of Breath and Swelling of Extremities. Gastrointestinal Present- Difficulty Swallowing and Indigestion. Not Present- Abdominal Pain, Bloating, Bloody Stool, Change in Bowel Habits, Chronic diarrhea, Constipation, Excessive gas, Gets full quickly at meals, Hemorrhoids, Nausea, Rectal Pain and Vomiting. Male Genitourinary Not Present- Blood in Urine, Change in Urinary Stream, Frequency, Impotence, Nocturia, Painful Urination, Urgency and Urine Leakage. Musculoskeletal Present- Back Pain, Joint Pain and Joint Stiffness. Not Present- Muscle Pain, Muscle Weakness and Swelling of Extremities. Neurological Present- Decreased Memory. Not Present- Fainting, Headaches, Numbness, Seizures, Tingling, Tremor, Trouble walking and Weakness. Psychiatric Present- Change in Sleep Pattern. Not Present- Anxiety, Bipolar, Depression, Fearful and Frequent crying. Endocrine Not Present- Cold Intolerance, Excessive Hunger, Hair Changes, Heat Intolerance, Hot flashes and New  Diabetes. Hematology Present- Gland problems. Not Present- Easy Bruising, Excessive bleeding, HIV and Persistent Infections.   Vitals (Sonya Bynum CMA; 08/09/2014 4:30 PM) 08/09/2014 4:30 PM Weight: 210 lb Height: 70in Body Surface Area: 2.17 m Body Mass Index:  30.13 kg/m Temp.: 30F(Temporal)  Pulse: 64 (Regular)  BP: 118/72 (Sitting, Left Arm, Standard)    Physical Exam [name] MD; 08/09/2014 5:47 PM) The physical exam findings are as follows: Note:General - appears comfortable, no distress; not diaphorectic  HEENT - normocephalic; sclerae clear, gaze conjugate; mucous membranes moist, dentition good; voice normal  Neck - asymmetric on extension; no palpable anterior or posterior cervical adenopathy; palpation of the left thyroid lobe shows no dominant nor discrete nodules; palpation of the right thyroid lobe shows an obvious smooth 3-4 cm nodule in the central portion of the lobe which is mobile with swallowing and nontender.  Chest - clear bilaterally with rhonchi, rales, or wheeze  Cor - regular rhythm with normal rate; no significant murmur  Ext - non-tender without significant edema or lymphedema  Neuro - grossly intact; no tremor    Assessment & Plan [name] MD; 08/09/2014 5:49 PM) NEOPLASM OF UNCERTAIN BEHAVIOR OF THYROID GLAND (D44.0) Current Plans  I had a lengthy discussion with the patient, his wife, and his mother. We reviewed all of the above information including his ultrasound and pathology report. I provided them with written literature to review at home.  I agree with Dr. Cindra Eves assessment to proceed with right thyroid lobectomy. This would provide for definitive tissue diagnosis and may improve his mild compressive symptoms. We discussed the risk and benefits of the procedure including the possibility of recurrent laryngeal nerve injury or injury to parathyroid glands. We discussed the possibility of malignancy, with the risk been  25-30%, and the potential need for completion thyroidectomy. We discussed the hospital stay and the postoperative recovery to be anticipated.  Patient would like to proceed with surgery in the near future. We will make arrangements at a time convenient for the patient.  The risks and benefits of the procedure have been discussed at length with the patient. The patient understands the proposed procedure, potential alternative treatments, and the course of recovery to be expected. All of the patient's questions have been answered at this time. The patient wishes to proceed with surgery.   Signed by [name], MD (08/09/2014 5:50 PM)

## 2014-08-23 ENCOUNTER — Ambulatory Visit (HOSPITAL_COMMUNITY): Payer: BC Managed Care – PPO | Admitting: Anesthesiology

## 2014-08-23 ENCOUNTER — Observation Stay (HOSPITAL_COMMUNITY)
Admission: RE | Admit: 2014-08-23 | Discharge: 2014-08-24 | Disposition: A | Discharge status: Home / Self Care | Payer: BC Managed Care – PPO | Source: Ambulatory Visit | Attending: Surgery | Admitting: Surgery

## 2014-08-23 ENCOUNTER — Encounter (HOSPITAL_COMMUNITY)
Admission: RE | Disposition: A | Discharge status: Home / Self Care | Payer: Self-pay | Source: Ambulatory Visit | Attending: Surgery

## 2014-08-23 ENCOUNTER — Encounter (HOSPITAL_COMMUNITY): Payer: BC Managed Care – PPO | Admitting: Anesthesiology

## 2014-08-23 ENCOUNTER — Encounter (HOSPITAL_COMMUNITY): Payer: Self-pay | Admitting: *Deleted

## 2014-08-23 DIAGNOSIS — C73 Malignant neoplasm of thyroid gland: Secondary | ICD-10-CM | POA: Diagnosis not present

## 2014-08-23 DIAGNOSIS — D440 Neoplasm of uncertain behavior of thyroid gland: Secondary | ICD-10-CM | POA: Diagnosis present

## 2014-08-23 DIAGNOSIS — Z85820 Personal history of malignant melanoma of skin: Secondary | ICD-10-CM | POA: Insufficient documentation

## 2014-08-23 HISTORY — PX: THYROID LOBECTOMY: SHX420

## 2014-08-23 SURGERY — LOBECTOMY, THYROID
Anesthesia: General | Site: Neck | Laterality: Right

## 2014-08-23 MED ORDER — DEXAMETHASONE SODIUM PHOSPHATE 10 MG/ML IJ SOLN
INTRAMUSCULAR | Status: DC | PRN
  Administered 2014-08-23: 10 mg via INTRAVENOUS

## 2014-08-23 MED ORDER — NEOSTIGMINE METHYLSULFATE 10 MG/10ML IV SOLN
INTRAVENOUS | Status: DC | PRN
  Administered 2014-08-23: 5 mg via INTRAVENOUS

## 2014-08-23 MED ORDER — FENTANYL CITRATE 0.05 MG/ML IJ SOLN
25.0000 ug | INTRAMUSCULAR | Status: DC | PRN
  Administered 2014-08-23 (×2): 50 ug via INTRAVENOUS

## 2014-08-23 MED ORDER — HYDROMORPHONE HCL 1 MG/ML IJ SOLN
INTRAMUSCULAR | Status: AC
  Filled 2014-08-23: qty 1

## 2014-08-23 MED ORDER — ONDANSETRON HCL 4 MG PO TABS: 4.0000 mg | ORAL_TABLET | Freq: Four times a day (QID) | ORAL | Status: DC | PRN

## 2014-08-23 MED ORDER — METOCLOPRAMIDE HCL 5 MG/ML IJ SOLN
INTRAMUSCULAR | Status: AC
  Filled 2014-08-23: qty 2

## 2014-08-23 MED ORDER — FENTANYL CITRATE 0.05 MG/ML IJ SOLN
INTRAMUSCULAR | Status: DC | PRN
  Administered 2014-08-23: 25 ug via INTRAVENOUS
  Administered 2014-08-23: 100 ug via INTRAVENOUS
  Administered 2014-08-23: 25 ug via INTRAVENOUS
  Administered 2014-08-23: 50 ug via INTRAVENOUS

## 2014-08-23 MED ORDER — LACTATED RINGERS IV SOLN: INTRAVENOUS | Status: DC

## 2014-08-23 MED ORDER — MIDAZOLAM HCL 5 MG/5ML IJ SOLN
INTRAMUSCULAR | Status: DC | PRN
  Administered 2014-08-23: 2 mg via INTRAVENOUS

## 2014-08-23 MED ORDER — PROPOFOL 10 MG/ML IV BOLUS
INTRAVENOUS | Status: AC
  Filled 2014-08-23: qty 20

## 2014-08-23 MED ORDER — METOCLOPRAMIDE HCL 5 MG/ML IJ SOLN
INTRAMUSCULAR | Status: DC | PRN
  Administered 2014-08-23: 10 mg via INTRAVENOUS

## 2014-08-23 MED ORDER — HYDROMORPHONE HCL 1 MG/ML IJ SOLN
0.2500 mg | INTRAMUSCULAR | Status: DC | PRN
  Administered 2014-08-23 (×2): 0.5 mg via INTRAVENOUS

## 2014-08-23 MED ORDER — DEXAMETHASONE SODIUM PHOSPHATE 10 MG/ML IJ SOLN
INTRAMUSCULAR | Status: AC
  Filled 2014-08-23: qty 1

## 2014-08-23 MED ORDER — PHENYLEPHRINE 40 MCG/ML (10ML) SYRINGE FOR IV PUSH (FOR BLOOD PRESSURE SUPPORT)
PREFILLED_SYRINGE | INTRAVENOUS | Status: AC
  Filled 2014-08-23: qty 10

## 2014-08-23 MED ORDER — ONDANSETRON HCL 4 MG/2ML IJ SOLN
INTRAMUSCULAR | Status: DC | PRN
  Administered 2014-08-23: 4 mg via INTRAVENOUS

## 2014-08-23 MED ORDER — MIDAZOLAM HCL 2 MG/2ML IJ SOLN
INTRAMUSCULAR | Status: AC
  Filled 2014-08-23: qty 2

## 2014-08-23 MED ORDER — CISATRACURIUM BESYLATE 20 MG/10ML IV SOLN
INTRAVENOUS | Status: AC
  Filled 2014-08-23: qty 10

## 2014-08-23 MED ORDER — KCL IN DEXTROSE-NACL 30-5-0.45 MEQ/L-%-% IV SOLN
INTRAVENOUS | Status: DC
  Administered 2014-08-23: 18:00:00 via INTRAVENOUS
  Filled 2014-08-23 (×2): qty 1000

## 2014-08-23 MED ORDER — SUCCINYLCHOLINE CHLORIDE 20 MG/ML IJ SOLN
INTRAMUSCULAR | Status: DC | PRN
  Administered 2014-08-23: 100 mg via INTRAVENOUS

## 2014-08-23 MED ORDER — GLYCOPYRROLATE 0.2 MG/ML IJ SOLN
INTRAMUSCULAR | Status: DC | PRN
  Administered 2014-08-23: .8 mg via INTRAVENOUS

## 2014-08-23 MED ORDER — ONDANSETRON HCL 4 MG/2ML IJ SOLN: 4.0000 mg | Freq: Four times a day (QID) | INTRAMUSCULAR | Status: DC | PRN

## 2014-08-23 MED ORDER — PHENYLEPHRINE HCL 10 MG/ML IJ SOLN
INTRAMUSCULAR | Status: DC | PRN
  Administered 2014-08-23 (×2): 160 ug via INTRAVENOUS

## 2014-08-23 MED ORDER — CEFAZOLIN SODIUM-DEXTROSE 2-3 GM-% IV SOLR
INTRAVENOUS | Status: AC
  Filled 2014-08-23: qty 50

## 2014-08-23 MED ORDER — ACETAMINOPHEN 325 MG PO TABS: 650.0000 mg | ORAL_TABLET | ORAL | Status: DC | PRN

## 2014-08-23 MED ORDER — 0.9 % SODIUM CHLORIDE (POUR BTL) OPTIME
TOPICAL | Status: DC | PRN
  Administered 2014-08-23: 1000 mL

## 2014-08-23 MED ORDER — ONDANSETRON HCL 4 MG/2ML IJ SOLN
INTRAMUSCULAR | Status: AC
  Filled 2014-08-23: qty 2

## 2014-08-23 MED ORDER — HYDROMORPHONE HCL 1 MG/ML IJ SOLN
0.2500 mg | INTRAMUSCULAR | Status: DC | PRN
  Administered 2014-08-23 (×4): 0.5 mg via INTRAVENOUS

## 2014-08-23 MED ORDER — HYDROMORPHONE HCL 1 MG/ML IJ SOLN: 1.0000 mg | INTRAMUSCULAR | Status: DC | PRN

## 2014-08-23 MED ORDER — PROMETHAZINE HCL 25 MG/ML IJ SOLN: 6.2500 mg | INTRAMUSCULAR | Status: DC | PRN

## 2014-08-23 MED ORDER — HYDROCODONE-ACETAMINOPHEN 5-325 MG PO TABS
1.0000 | ORAL_TABLET | ORAL | Status: DC | PRN
  Administered 2014-08-23: 1 via ORAL
  Administered 2014-08-23 – 2014-08-24 (×2): 2 via ORAL
  Filled 2014-08-23 (×2): qty 2
  Filled 2014-08-23: qty 1

## 2014-08-23 MED ORDER — CISATRACURIUM BESYLATE (PF) 10 MG/5ML IV SOLN
INTRAVENOUS | Status: DC | PRN
  Administered 2014-08-23: 7 mg via INTRAVENOUS
  Administered 2014-08-23: 2 mg via INTRAVENOUS

## 2014-08-23 MED ORDER — FENTANYL CITRATE 0.05 MG/ML IJ SOLN
INTRAMUSCULAR | Status: AC
  Filled 2014-08-23: qty 2

## 2014-08-23 MED ORDER — CEFAZOLIN SODIUM-DEXTROSE 2-3 GM-% IV SOLR
2.0000 g | INTRAVENOUS | Status: AC
  Administered 2014-08-23: 2 g via INTRAVENOUS

## 2014-08-23 MED ORDER — ACETAMINOPHEN 10 MG/ML IV SOLN
1000.0000 mg | Freq: Once | INTRAVENOUS | Status: AC
  Administered 2014-08-23: 1000 mg via INTRAVENOUS
  Filled 2014-08-23: qty 100

## 2014-08-23 MED ORDER — GLYCOPYRROLATE 0.2 MG/ML IJ SOLN
INTRAMUSCULAR | Status: AC
  Filled 2014-08-23: qty 4

## 2014-08-23 MED ORDER — FENTANYL CITRATE 0.05 MG/ML IJ SOLN
INTRAMUSCULAR | Status: AC
  Filled 2014-08-23: qty 5

## 2014-08-23 MED ORDER — LACTATED RINGERS IV SOLN
INTRAVENOUS | Status: DC | PRN
  Administered 2014-08-23 (×2): via INTRAVENOUS

## 2014-08-23 MED ORDER — INFLUENZA VAC SPLIT QUAD 0.5 ML IM SUSY
0.5000 mL | PREFILLED_SYRINGE | INTRAMUSCULAR | Status: AC
  Administered 2014-08-24: 0.5 mL via INTRAMUSCULAR
  Filled 2014-08-23 (×2): qty 0.5

## 2014-08-23 MED ORDER — PROPOFOL 10 MG/ML IV BOLUS
INTRAVENOUS | Status: DC | PRN
  Administered 2014-08-23: 150 mg via INTRAVENOUS

## 2014-08-23 MED ORDER — HYDROMORPHONE HCL 1 MG/ML IJ SOLN
INTRAMUSCULAR | Status: AC
Start: 2014-08-23 — End: 2014-08-24
  Filled 2014-08-23: qty 1

## 2014-08-23 SURGICAL SUPPLY — 39 items
APL SKNCLS STERI-STRIP NONHPOA (GAUZE/BANDAGES/DRESSINGS) ×1
ATTRACTOMAT 16X20 MAGNETIC DRP (DRAPES) ×2 IMPLANT
BENZOIN TINCTURE PRP APPL 2/3 (GAUZE/BANDAGES/DRESSINGS) ×2 IMPLANT
BLADE HEX COATED 2.75 (ELECTRODE) ×2 IMPLANT
BLADE SURG 15 STRL LF DISP TIS (BLADE) ×1 IMPLANT
BLADE SURG 15 STRL SS (BLADE) ×2
CANISTER SUCTION 2500CC (MISCELLANEOUS) ×1 IMPLANT
CHLORAPREP W/TINT 10.5 ML (MISCELLANEOUS) ×2 IMPLANT
CLIP TI MEDIUM 6 (CLIP) ×4 IMPLANT
CLIP TI WIDE RED SMALL 6 (CLIP) ×5 IMPLANT
DISSECTOR ROUND CHERRY 3/8 STR (MISCELLANEOUS) IMPLANT
DRAPE PED LAPAROTOMY (DRAPES) ×2 IMPLANT
DRESSING SURGICEL FIBRLLR 1X2 (HEMOSTASIS) ×1 IMPLANT
DRSG SURGICEL FIBRILLAR 1X2 (HEMOSTASIS) ×2
ELECT REM PT RETURN 9FT ADLT (ELECTROSURGICAL) ×2
ELECTRODE REM PT RTRN 9FT ADLT (ELECTROSURGICAL) ×1 IMPLANT
GAUZE SPONGE 4X4 12PLY STRL (GAUZE/BANDAGES/DRESSINGS) ×1 IMPLANT
GAUZE SPONGE 4X4 16PLY XRAY LF (GAUZE/BANDAGES/DRESSINGS) ×2 IMPLANT
GLOVE SURG ORTHO 8.0 STRL STRW (GLOVE) ×2 IMPLANT
GOWN STRL REUS W/TWL LRG LVL3 (GOWN DISPOSABLE) ×2 IMPLANT
GOWN STRL REUS W/TWL XL LVL3 (GOWN DISPOSABLE) ×4 IMPLANT
KIT BASIN OR (CUSTOM PROCEDURE TRAY) ×2 IMPLANT
MANIFOLD NEPTUNE II (INSTRUMENTS) ×1 IMPLANT
NS IRRIG 1000ML POUR BTL (IV SOLUTION) ×2 IMPLANT
PACK BASIC VI WITH GOWN DISP (CUSTOM PROCEDURE TRAY) ×2 IMPLANT
PENCIL BUTTON HOLSTER BLD 10FT (ELECTRODE) ×2 IMPLANT
SHEARS HARMONIC 9CM CVD (BLADE) ×2 IMPLANT
STAPLER VISISTAT 35W (STAPLE) ×2 IMPLANT
STRIP CLOSURE SKIN 1/2X4 (GAUZE/BANDAGES/DRESSINGS) ×2 IMPLANT
SUT MNCRL AB 4-0 PS2 18 (SUTURE) ×2 IMPLANT
SUT SILK 2 0 (SUTURE) ×2
SUT SILK 2-0 18XBRD TIE 12 (SUTURE) ×1 IMPLANT
SUT SILK 3 0 (SUTURE)
SUT SILK 3-0 18XBRD TIE 12 (SUTURE) IMPLANT
SUT VIC AB 3-0 SH 18 (SUTURE) ×2 IMPLANT
SYR BULB IRRIGATION 50ML (SYRINGE) ×2 IMPLANT
TAPE CLOTH SURG 4X10 WHT LF (GAUZE/BANDAGES/DRESSINGS) ×1 IMPLANT
TOWEL OR 17X26 10 PK STRL BLUE (TOWEL DISPOSABLE) ×2 IMPLANT
YANKAUER SUCT BULB TIP 10FT TU (MISCELLANEOUS) ×2 IMPLANT

## 2014-08-23 NOTE — Anesthesia Postprocedure Evaluation (Signed)
  Anesthesia Post-op Note  Patient: Douglas [name]  Procedure(s) Performed: Procedure(s) (LRB): RIGHT THYROID LOBECTOMY (Right)  Patient Location: PACU  Anesthesia Type: General  Level of Consciousness: awake and alert   Airway and Oxygen Therapy: Patient Spontanous Breathing  Post-op Pain: mild  Post-op Assessment: Post-op Vital signs reviewed, Patient's Cardiovascular Status Stable, Respiratory Function Stable, Patent Airway and No signs of Nausea or vomiting  Last Vitals:  Filed Vitals:   08/23/14 1415  BP: 157/92  Pulse: 66  Temp:   Resp: 15    Post-op Vital Signs: stable   Complications: No apparent anesthesia complications

## 2014-08-23 NOTE — Interval H&P Note (Signed)
History and Physical Interval Note:  08/23/2014 7:34 AM  Douglas [name]  has presented today for surgery, with the diagnosis of RIGHT THYROID NEOPLASM OF UNCERTAIN BEHAVIOR.  The various methods of treatment have been discussed with the patient and family. After consideration of risks, benefits and other options for treatment, the patient has consented to    Procedure(s): RIGHT THYROID LOBECTOMY (Right) as a surgical intervention .    The patient's history has been reviewed, patient examined, no change in status, stable for surgery.  I have reviewed the patient's chart and labs.  Questions were answered to the patient's satisfaction.    [name], MD, [facility] Office: [place]

## 2014-08-23 NOTE — Transfer of Care (Signed)
Immediate Anesthesia Transfer of Care Note  Patient: Douglas [name]  Procedure(s) Performed: Procedure(s): RIGHT THYROID LOBECTOMY (Right)  Patient Location: PACU  Anesthesia Type:General  Level of Consciousness: awake, sedated and patient cooperative  Airway & Oxygen Therapy: Patient Spontanous Breathing and Patient connected to face mask oxygen  Post-op Assessment: Report given to PACU RN and Post -op Vital signs reviewed and stable  Post vital signs: Reviewed and stable  Complications: No apparent anesthesia complications

## 2014-08-23 NOTE — Brief Op Note (Signed)
08/23/2014  1:55 PM  PATIENT:  Douglas [name]  53 y.o. male  PRE-OPERATIVE DIAGNOSIS:  RIGHT THYROID NEOPLASM OF UNCERTAIN BEHAVIOR  POST-OPERATIVE DIAGNOSIS:  RIGHT THYROID NEOPLASM OF UNCERTAIN BEHAVIOR  PROCEDURE:  Procedure(s): RIGHT THYROID LOBECTOMY (Right)  SURGEON:  Surgeon(s) and Role:    * Armandina Gemma, MD - Primary  ANESTHESIA:   general  EBL:  Total I/O In: 1000 [I.V.:1000] Out: -   BLOOD ADMINISTERED:none  DRAINS: none   LOCAL MEDICATIONS USED:  None   SPECIMEN:  Excision  DISPOSITION OF SPECIMEN:  PATHOLOGY  COUNTS:  YES   TOURNIQUET:  * No tourniquets in log *  DICTATION: .Other Dictation: Dictation Number [id number]  PLAN OF CARE: Admit for overnight observation  PATIENT DISPOSITION:  PACU - hemodynamically stable.   Delay start of Pharmacological VTE agent (>24hrs) due to surgical blood loss or risk of bleeding: yes  [name], MD, [facility] Office: [phone]

## 2014-08-23 NOTE — Op Note (Signed)
NAMEFERGUS, Douglas [name]             ACCOUNT NO.:  [id number]  MEDICAL RECORD NO.:  [id number]  LOCATION:  3716                         FACILITY:  [facility]  PHYSICIAN:  [name], MD      DATE OF BIRTH:  05/16/1961  DATE OF PROCEDURE:  08/23/2014                              OPERATIVE REPORT   PREOPERATIVE DIAGNOSIS:  Right thyroid neoplasm of uncertain behavior.  POSTOPERATIVE DIAGNOSIS:  Right thyroid neoplasm of uncertain behavior.  PROCEDURE:  Right thyroid lobectomy.  SURGEON:  Armandina Gemma, MD, FACS  ANESTHESIA:  General.  ESTIMATED BLOOD LOSS:  Minimal.  PREPARATION:  ChloraPrep.  COMPLICATIONS:  None.  INDICATIONS:  The patient is a 53 year old male who presents with a dominant right-sided thyroid nodule.  The patient had undergone evaluation including ultrasound showing a 3.6-cm mass in the right thyroid lobe.  Fine needle aspiration biopsy showed a follicular lesion with cytologic atypia.  The patient now comes to Surgery for excision for definitive diagnosis.  BODY OF REPORT:  Procedure was done in OR #6 at the [facility].  The patient was brought to the operating room, placed in a supine position on the operating room table.  Following administration of general anesthesia, the patient was positioned and then prepped and draped in the usual strict aseptic fashion.  After ascertaining that an adequate level of anesthesia had been achieved, a Kocher incision was made with a #15 blade.  Dissection was carried through subcutaneous tissues and platysma.  Hemostasis was achieved with electrocautery.  Subplatysmal flaps were developed cephalad and caudad from the thyroid notch to the sternal notch.  A Mahorner self-retaining retractor was placed for exposure.  Strap muscles were incised in the midline.  The anterior portion of the left thyroid lobe was exposed.  It appears normal.  Palpation shows no dominant nor discrete masses.  There was no  palpable lymphadenopathy.  Next, we turned our attention to the right thyroid lobe.  Strap muscles were mobilized off the right thyroid lobe.  They were reflected laterally exposing the entire lobe.  Middle thyroid vein was divided between Ligaclips.  Superior pole was dissected out and superior pole vessels divided between medium and small Ligaclips with the Harmonic scalpel.  Parathyroid tissue was identified and preserved.  Inferior venous tributaries were divided between Ligaclips.  Right lobe was rolled anteriorly.  Branches of the inferior thyroid artery were divided between small Ligaclips.  Recurrent laryngeal nerve was identified and preserved along its course.  Inferior parathyroid gland was also identified and preserved.  Remaining branches of the inferior thyroid artery were divided between small Ligaclips and the ligament of Gwenlyn Found was released with the electrocautery.  Gland was mobilized up and onto the anterior trachea.  Isthmus was mobilized across the midline.  There was no significant pyramidal lobe present.  The thyroid parenchyma was transected at the junction between the isthmus and left thyroid lobe using the Harmonic scalpel.  A single suture was used to mark the right superior pole.  A double suture was used to mark the margin of the isthmus.  The entire right lobe was submitted to Pathology for review.  The neck was irrigated with warm saline  and good hemostasis was noted throughout the operative field.  Fibrillar was placed throughout the operative field.  Strap muscles were reapproximated in the midline with interrupted 3-0 Vicryl sutures.  Platysma was closed with interrupted 3- 0 Vicryl sutures.  Skin was closed with a running 4-0 Monocryl subcuticular suture.  Wound was washed and dried and benzoin and Steri- Strips were applied.  Sterile dressings were applied.  The patient was awakened from anesthesia and brought to the recovery room.  The  patient tolerated the procedure well.   [name], MD, [facility] Office: [phone]   TMG/MEDQ  D:  08/23/2014  T:  08/23/2014  Job:  [id number]  cc:   Katina Degree, M.D. Fax: [phone]

## 2014-08-24 ENCOUNTER — Encounter (HOSPITAL_COMMUNITY): Payer: Self-pay | Admitting: Surgery

## 2014-08-24 DIAGNOSIS — C73 Malignant neoplasm of thyroid gland: Secondary | ICD-10-CM | POA: Diagnosis not present

## 2014-08-24 MED ORDER — HYDROCODONE-ACETAMINOPHEN 5-325 MG PO TABS: 1.0000 | ORAL_TABLET | ORAL | Status: DC | PRN

## 2014-08-24 NOTE — Progress Notes (Signed)
Discharge instructions given with prescriptions.  Questions answered 

## 2014-08-24 NOTE — Discharge Summary (Signed)
Physician Discharge Summary [facility]  Patient ID: Douglas [name] MRN: [id number] DOB/AGE: 03/24/1961 53 y.o.  Admit date: 08/23/2014 Discharge date: 08/24/2014  Admission Diagnoses:  Right thyroid nodule with atypia  Discharge Diagnoses:  Principal Problem:   Neoplasm of uncertain behavior of thyroid gland, right lobe   Discharged Condition: good  Hospital Course: Patient was admitted for observation following thyroid surgery.  Post op course was uncomplicated.  Pain was well controlled.  Tolerated diet.  Patient was prepared for discharge home on POD#1.  Consults: None  Treatments: surgery: right thyroid lobectomy  Discharge Exam: Blood pressure 125/80, pulse 80, temperature 98.4 F (36.9 C), temperature source Oral, resp. rate 16, height 5\' 11"  (1.803 m), weight 212 lb 15.4 oz (96.6 kg), SpO2 96.00%. HEENT - clear Neck - wound clear and intact; mild STS; voice normal Chest - clear bilaterally Cor - RRR   Disposition: Home  Discharge Instructions   Diet - low sodium heart healthy    Complete by:  As directed      Discharge instructions    Complete by:  As directed   THYROID & PARATHYROID SURGERY - POST OP INSTRUCTIONS  Always review your discharge instruction sheet from the facility where your surgery was performed.  A prescription for pain medication may be given to you upon discharge.  Take your pain medication as prescribed.  If narcotic pain medicine is not needed, then you may take acetaminophen (Tylenol) or ibuprofen (Advil) as needed.  Take your usually prescribed medications unless otherwise directed.  If you need a refill on your pain medication, please contact your pharmacy. They will contact our office to request authorization.  Prescriptions will not be processed after 5 pm or on weekends.  Start with a light diet upon arrival home, such as soup and crackers or toast.  Be sure to drink plenty of fluids daily.  Resume your normal  diet the day after surgery.  Most patients will experience some swelling and bruising on the chest and neck area.  Ice packs will help.  Swelling and bruising can take several days to resolve.   It is common to experience some constipation if taking pain medication after surgery.  Increasing fluid intake and taking a stool softener will usually help or prevent this problem.  A mild laxative (Milk of Magnesia or Miralax) should be taken according to package directions if there are no bowel movements after 48 hours.  You may remove your bandages 24-48 hours after surgery, and you may shower at that time.  You have steri-strips (small skin tapes) in place directly over the incision.  These strips should be left on the skin for 7-10 days and then removed.  You may resume regular (light) daily activities beginning the next day-such as daily self-care, walking, climbing stairs-gradually increasing activities as tolerated.  You may have sexual intercourse when it is comfortable.  Refrain from any heavy lifting or straining until approved by your doctor.  You may drive when you no longer are taking prescription pain medication, you can comfortably wear a seatbelt, and you can safely maneuver your car and apply brakes.  You should see your doctor in the office for a follow-up appointment approximately two to three weeks after your surgery.  Make sure that you call for this appointment within a day or two after you arrive home to insure a convenient appointment time.  WHEN TO CALL YOUR DOCTOR: -- Fever greater than 101.5 -- Inability to urinate --  Nausea and/or vomiting - persistent -- Extreme swelling or bruising -- Continued bleeding from incision -- Increased pain, redness, or drainage from the incision -- Difficulty swallowing or breathing -- Muscle cramping or spasms -- Numbness or tingling in hands or around lips  The clinic staff is available to answer your questions during regular business  hours.  Please don't hesitate to call and ask to speak to one of the nurses if you have concerns.  [name], MD, [facility] Office: [phone]     Increase activity slowly    Complete by:  As directed      Remove dressing in 24 hours    Complete by:  As directed   Apply light gauze dressing to wound before discharge home today.            Medication List         HYDROcodone-acetaminophen 5-325 MG per tablet  Commonly known as:  NORCO/VICODIN  Take 1-2 tablets by mouth every 4 (four) hours as needed for moderate pain.           Follow-up Information   Follow up with [name], MD. Schedule an appointment as soon as possible for a visit in 3 weeks. (For wound re-check)    Specialty:  General Surgery   Contact information:   [address] [place]  [address] [phone]       [name], MD, [facility] Office: [phone]   Signed: [name] 08/24/2014, 1:31 PM

## 2014-08-28 ENCOUNTER — Telehealth (INDEPENDENT_AMBULATORY_CARE_PROVIDER_SITE_OTHER): Payer: Self-pay

## 2014-08-28 NOTE — Telephone Encounter (Signed)
Pt is requesting pathology results.

## 2014-08-29 ENCOUNTER — Telehealth (INDEPENDENT_AMBULATORY_CARE_PROVIDER_SITE_OTHER): Payer: Self-pay | Admitting: General Surgery

## 2014-08-29 ENCOUNTER — Ambulatory Visit (INDEPENDENT_AMBULATORY_CARE_PROVIDER_SITE_OTHER): Payer: Self-pay | Admitting: Surgery

## 2014-08-29 NOTE — Telephone Encounter (Signed)
Patient's wife called wanting a return call from Dr. Harlow Asa in regards to her husbands pathology that he discussed earlier today.  The wife has some further questions and would like it explained to her since she was not with her husband during the pathology reading from Dr. Harlow Asa.  She explained that he husband is still a little unclear of everything that is going on.  Explained that Dr. Harlow Asa is unavailable this afternoon but that I would send this message to him in the hopes that he will return there call tomorrow.

## 2014-08-29 NOTE — Progress Notes (Signed)
Quick Note:  Called results to patient. Will schedule completion thyroidectomy.  [name], MD, [facility] Office: [phone]   ______

## 2014-08-31 NOTE — Telephone Encounter (Signed)
Dr. Harlow Asa called pt's wife.

## 2014-08-31 NOTE — Telephone Encounter (Signed)
Dr Harlow Asa will not be available until next weds when he returns. This will be sent to him in epic and allscripts in case he signs in to review msgs while he is away.

## 2014-09-05 ENCOUNTER — Encounter (HOSPITAL_COMMUNITY)
Admission: RE | Admit: 2014-09-05 | Discharge: 2014-09-05 | Disposition: A | Discharge status: Home / Self Care | Payer: BC Managed Care – PPO | Source: Ambulatory Visit | Attending: Surgery | Admitting: Surgery

## 2014-09-05 ENCOUNTER — Encounter (HOSPITAL_COMMUNITY): Payer: Self-pay

## 2014-09-05 DIAGNOSIS — Z01818 Encounter for other preprocedural examination: Secondary | ICD-10-CM | POA: Diagnosis not present

## 2014-09-05 HISTORY — DX: Malignant (primary) neoplasm, unspecified: C80.1

## 2014-09-05 HISTORY — DX: Other lesions of oral mucosa: K13.79

## 2014-09-05 NOTE — Patient Instructions (Addendum)
[place]  09/05/2014   Your procedure is scheduled on:   09-11-2014 Tuesday  Enter through [facility] Entrance and follow signs to [facility]. Arrive at      0830  AM .  Call this number if you have problems the morning of surgery: [phone]  Or Presurgical Testing [phone].   For Living Will and/or Health Care Power Attorney Forms: please provide copy for your medical record,may bring AM of surgery(Forms should be already notarized -we do not provide this service).(09-05-14  No information preferred today).     Do not eat food/ or drink: After Midnight.      Take these medicines the morning of surgery with A SIP OF WATER: Hydrocodone-if need   Do not wear jewelry, make-up or nail polish.  Do not wear deodorant, lotions, powders, or perfumes.   Do not shave legs and under arms- 48 hours(2 days) prior to first CHG shower.(Shaving face and neck okay.)  Do not bring valuables to the hospital.(Hospital is not responsible for lost valuables).  Contacts, dentures or removable bridgework, body piercing, hair pins may not be worn into surgery.  Leave suitcase in the car. After surgery it may be brought to your room.  For patients admitted to the hospital, checkout time is 11:00 AM the day of discharge.(Restricted visitors-Any Persons displaying flu-like symptoms or illness).    Patients discharged the day of surgery will not be allowed to drive home. Must have responsible person with you x 24 hours once discharged.  Name and phone number of your driver: spouse Juliann Pulse [phone] cell                [place] - Preparing for Surgery Before surgery, you can play an important role.  Because skin is not sterile, your skin needs to be as free of germs as possible.  You can reduce the number of germs on your skin by washing with CHG (chlorahexidine gluconate) soap before surgery.  CHG is an antiseptic cleaner which kills germs and bonds with the skin to  continue killing germs even after washing. Please DO NOT use if you have an allergy to CHG or antibacterial soaps.  If your skin becomes reddened/irritated stop using the CHG and inform your nurse when you arrive at Short Stay. Do not shave (including legs and underarms) for at least 48 hours prior to the first CHG shower.  You may shave your face/neck. Please follow these instructions carefully:  1.  Shower with CHG Soap the night before surgery and the  morning of Surgery.  2.  If you choose to wash your hair, wash your hair first as usual with your  normal  shampoo.  3.  After you shampoo, rinse your hair and body thoroughly to remove the  shampoo.                           4.  Use CHG as you would any other liquid soap.  You can apply chg directly  to the skin and wash                       Gently with a scrungie or clean washcloth.  5.  Apply the CHG Soap to your body ONLY FROM THE NECK DOWN.   Do not use on face/ open  Wound or open sores. Avoid contact with eyes, ears mouth and genitals (private parts).                       Wash face,  Genitals (private parts) with your normal soap.             6.  Wash thoroughly, paying special attention to the area where your surgery  will be performed.  7.  Thoroughly rinse your body with warm water from the neck down.  8.  DO NOT shower/wash with your normal soap after using and rinsing off  the CHG Soap.                9.  Pat yourself dry with a clean towel.            10.  Wear clean pajamas.            11.  Place clean sheets on your bed the night of your first shower and do not  sleep with pets. Day of Surgery : Do not apply any lotions/deodorants the morning of surgery.  Please wear clean clothes to the hospital/surgery center.  FAILURE TO FOLLOW THESE INSTRUCTIONS MAY RESULT IN THE CANCELLATION OF YOUR SURGERY PATIENT SIGNATURE_________________________________  NURSE  SIGNATURE__________________________________  ________________________________________________________________________

## 2014-09-05 NOTE — Pre-Procedure Instructions (Signed)
09-05-14 Dr. Kalman Shan states no CXR needed today. CT Chest 4''15 Epic. 08-20-14 CBC -Epic, no further labs today.

## 2014-09-11 ENCOUNTER — Observation Stay (HOSPITAL_COMMUNITY)
Admission: RE | Admit: 2014-09-11 | Discharge: 2014-09-12 | Disposition: A | Discharge status: Home / Self Care | Payer: BC Managed Care – PPO | Source: Ambulatory Visit | Attending: Surgery | Admitting: Surgery

## 2014-09-11 ENCOUNTER — Ambulatory Visit (HOSPITAL_COMMUNITY): Payer: BC Managed Care – PPO | Admitting: Anesthesiology

## 2014-09-11 ENCOUNTER — Encounter (HOSPITAL_COMMUNITY)
Admission: RE | Disposition: A | Discharge status: Home / Self Care | Payer: Self-pay | Source: Ambulatory Visit | Attending: Surgery

## 2014-09-11 ENCOUNTER — Encounter (HOSPITAL_COMMUNITY): Payer: Self-pay | Admitting: *Deleted

## 2014-09-11 DIAGNOSIS — C73 Malignant neoplasm of thyroid gland: Secondary | ICD-10-CM | POA: Diagnosis not present

## 2014-09-11 DIAGNOSIS — K219 Gastro-esophageal reflux disease without esophagitis: Secondary | ICD-10-CM | POA: Insufficient documentation

## 2014-09-11 DIAGNOSIS — Z85820 Personal history of malignant melanoma of skin: Secondary | ICD-10-CM | POA: Insufficient documentation

## 2014-09-11 HISTORY — PX: THYROIDECTOMY: SHX17

## 2014-09-11 SURGERY — THYROIDECTOMY
Anesthesia: General

## 2014-09-11 MED ORDER — CISATRACURIUM BESYLATE (PF) 10 MG/5ML IV SOLN
INTRAVENOUS | Status: DC | PRN
  Administered 2014-09-11: 2 mg via INTRAVENOUS
  Administered 2014-09-11: 6 mg via INTRAVENOUS

## 2014-09-11 MED ORDER — LACTATED RINGERS IV SOLN
INTRAVENOUS | Status: DC
  Administered 2014-09-11: 1000 mL via INTRAVENOUS
  Administered 2014-09-11: 12:00:00 via INTRAVENOUS

## 2014-09-11 MED ORDER — PHENYLEPHRINE 40 MCG/ML (10ML) SYRINGE FOR IV PUSH (FOR BLOOD PRESSURE SUPPORT)
PREFILLED_SYRINGE | INTRAVENOUS | Status: AC
  Filled 2014-09-11: qty 10

## 2014-09-11 MED ORDER — FENTANYL CITRATE 0.05 MG/ML IJ SOLN
INTRAMUSCULAR | Status: AC
  Filled 2014-09-11: qty 5

## 2014-09-11 MED ORDER — MIDAZOLAM HCL 5 MG/5ML IJ SOLN
INTRAMUSCULAR | Status: DC | PRN
  Administered 2014-09-11: 2 mg via INTRAVENOUS

## 2014-09-11 MED ORDER — HYDROCODONE-ACETAMINOPHEN 5-325 MG PO TABS: 1.0000 | ORAL_TABLET | ORAL | Status: DC | PRN

## 2014-09-11 MED ORDER — ONDANSETRON HCL 4 MG/2ML IJ SOLN: 4.0000 mg | Freq: Four times a day (QID) | INTRAMUSCULAR | Status: DC | PRN

## 2014-09-11 MED ORDER — HYDROMORPHONE HCL 1 MG/ML IJ SOLN
INTRAMUSCULAR | Status: AC
  Filled 2014-09-11: qty 1

## 2014-09-11 MED ORDER — METOCLOPRAMIDE HCL 5 MG/ML IJ SOLN
INTRAMUSCULAR | Status: AC
  Filled 2014-09-11: qty 2

## 2014-09-11 MED ORDER — HYDROMORPHONE HCL 1 MG/ML IJ SOLN
0.2500 mg | INTRAMUSCULAR | Status: DC | PRN
  Administered 2014-09-11 (×4): 0.5 mg via INTRAVENOUS

## 2014-09-11 MED ORDER — KCL IN DEXTROSE-NACL 30-5-0.45 MEQ/L-%-% IV SOLN
INTRAVENOUS | Status: DC
  Administered 2014-09-11: 50 mL/h via INTRAVENOUS
  Filled 2014-09-11 (×4): qty 1000

## 2014-09-11 MED ORDER — ONDANSETRON HCL 4 MG/2ML IJ SOLN
INTRAMUSCULAR | Status: DC | PRN
  Administered 2014-09-11: 4 mg via INTRAVENOUS

## 2014-09-11 MED ORDER — DEXAMETHASONE SODIUM PHOSPHATE 10 MG/ML IJ SOLN
INTRAMUSCULAR | Status: AC
  Filled 2014-09-11: qty 1

## 2014-09-11 MED ORDER — PROPOFOL 10 MG/ML IV BOLUS
INTRAVENOUS | Status: DC | PRN
  Administered 2014-09-11: 150 mg via INTRAVENOUS

## 2014-09-11 MED ORDER — ONDANSETRON HCL 4 MG PO TABS: 4.0000 mg | ORAL_TABLET | Freq: Four times a day (QID) | ORAL | Status: DC | PRN

## 2014-09-11 MED ORDER — PROPOFOL 10 MG/ML IV BOLUS
INTRAVENOUS | Status: AC
  Filled 2014-09-11: qty 20

## 2014-09-11 MED ORDER — HYDROMORPHONE HCL 1 MG/ML IJ SOLN
INTRAMUSCULAR | Status: AC
  Administered 2014-09-11: 1 mg via INTRAVENOUS
  Filled 2014-09-11: qty 1

## 2014-09-11 MED ORDER — OXYCODONE HCL 5 MG/5ML PO SOLN
5.0000 mg | Freq: Once | ORAL | Status: DC | PRN
  Filled 2014-09-11: qty 5

## 2014-09-11 MED ORDER — SUCCINYLCHOLINE CHLORIDE 20 MG/ML IJ SOLN
INTRAMUSCULAR | Status: DC | PRN
  Administered 2014-09-11: 100 mg via INTRAVENOUS

## 2014-09-11 MED ORDER — CEFAZOLIN SODIUM-DEXTROSE 2-3 GM-% IV SOLR
INTRAVENOUS | Status: AC
  Filled 2014-09-11: qty 50

## 2014-09-11 MED ORDER — NEOSTIGMINE METHYLSULFATE 10 MG/10ML IV SOLN
INTRAVENOUS | Status: DC | PRN
  Administered 2014-09-11: 5 mg via INTRAVENOUS

## 2014-09-11 MED ORDER — HYDROCODONE-ACETAMINOPHEN 5-325 MG PO TABS
1.0000 | ORAL_TABLET | ORAL | Status: DC | PRN
  Administered 2014-09-11: 2 via ORAL
  Administered 2014-09-12: 1 via ORAL
  Filled 2014-09-11: qty 2
  Filled 2014-09-11: qty 1

## 2014-09-11 MED ORDER — HYDROMORPHONE HCL 1 MG/ML IJ SOLN
1.0000 mg | INTRAMUSCULAR | Status: DC | PRN
Start: 2014-09-11 — End: 2014-09-12
  Administered 2014-09-11: 1 mg via INTRAVENOUS

## 2014-09-11 MED ORDER — ACETAMINOPHEN 325 MG PO TABS: 325.0000 mg | ORAL_TABLET | ORAL | Status: DC | PRN

## 2014-09-11 MED ORDER — FENTANYL CITRATE 0.05 MG/ML IJ SOLN
INTRAMUSCULAR | Status: DC | PRN
  Administered 2014-09-11: 50 ug via INTRAVENOUS
  Administered 2014-09-11: 25 ug via INTRAVENOUS
  Administered 2014-09-11: 75 ug via INTRAVENOUS
  Administered 2014-09-11: 100 ug via INTRAVENOUS

## 2014-09-11 MED ORDER — PHENYLEPHRINE HCL 10 MG/ML IJ SOLN
INTRAMUSCULAR | Status: DC | PRN
  Administered 2014-09-11: 160 ug via INTRAVENOUS
  Administered 2014-09-11: 120 ug via INTRAVENOUS
  Administered 2014-09-11: 160 ug via INTRAVENOUS
  Administered 2014-09-11: 120 ug via INTRAVENOUS

## 2014-09-11 MED ORDER — BUPIVACAINE-EPINEPHRINE (PF) 0.25% -1:200000 IJ SOLN
INTRAMUSCULAR | Status: AC
  Filled 2014-09-11: qty 30

## 2014-09-11 MED ORDER — GLYCOPYRROLATE 0.2 MG/ML IJ SOLN
INTRAMUSCULAR | Status: AC
  Filled 2014-09-11: qty 4

## 2014-09-11 MED ORDER — NEOSTIGMINE METHYLSULFATE 10 MG/10ML IV SOLN
INTRAVENOUS | Status: AC
  Filled 2014-09-11: qty 1

## 2014-09-11 MED ORDER — MIDAZOLAM HCL 2 MG/2ML IJ SOLN
INTRAMUSCULAR | Status: AC
  Filled 2014-09-11: qty 2

## 2014-09-11 MED ORDER — ACETAMINOPHEN 325 MG PO TABS: 650.0000 mg | ORAL_TABLET | ORAL | Status: DC | PRN

## 2014-09-11 MED ORDER — METOCLOPRAMIDE HCL 5 MG/ML IJ SOLN
INTRAMUSCULAR | Status: DC | PRN
  Administered 2014-09-11: 10 mg via INTRAVENOUS

## 2014-09-11 MED ORDER — GLYCOPYRROLATE 0.2 MG/ML IJ SOLN
INTRAMUSCULAR | Status: DC | PRN
Start: 2014-09-11 — End: 2014-09-11
  Administered 2014-09-11: .8 mg via INTRAVENOUS

## 2014-09-11 MED ORDER — DEXAMETHASONE SODIUM PHOSPHATE 10 MG/ML IJ SOLN
INTRAMUSCULAR | Status: DC | PRN
  Administered 2014-09-11: 10 mg via INTRAVENOUS

## 2014-09-11 MED ORDER — CEFAZOLIN SODIUM-DEXTROSE 2-3 GM-% IV SOLR
2.0000 g | INTRAVENOUS | Status: AC
  Administered 2014-09-11: 2 g via INTRAVENOUS

## 2014-09-11 MED ORDER — ACETAMINOPHEN 160 MG/5ML PO SOLN
325.0000 mg | ORAL | Status: DC | PRN
  Filled 2014-09-11: qty 20.3

## 2014-09-11 MED ORDER — OXYCODONE HCL 5 MG PO TABS: 5.0000 mg | ORAL_TABLET | Freq: Once | ORAL | Status: DC | PRN

## 2014-09-11 MED ORDER — ONDANSETRON HCL 4 MG/2ML IJ SOLN
INTRAMUSCULAR | Status: AC
  Filled 2014-09-11: qty 2

## 2014-09-11 MED ORDER — 0.9 % SODIUM CHLORIDE (POUR BTL) OPTIME
TOPICAL | Status: DC | PRN
  Administered 2014-09-11: 1000 mL

## 2014-09-11 MED ORDER — CISATRACURIUM BESYLATE 20 MG/10ML IV SOLN
INTRAVENOUS | Status: AC
  Filled 2014-09-11: qty 10

## 2014-09-11 SURGICAL SUPPLY — 39 items
APL SKNCLS STERI-STRIP NONHPOA (GAUZE/BANDAGES/DRESSINGS) ×1
ATTRACTOMAT 16X20 MAGNETIC DRP (DRAPES) ×2 IMPLANT
BENZOIN TINCTURE PRP APPL 2/3 (GAUZE/BANDAGES/DRESSINGS) ×2 IMPLANT
BLADE HEX COATED 2.75 (ELECTRODE) ×2 IMPLANT
BLADE SURG 15 STRL LF DISP TIS (BLADE) ×1 IMPLANT
BLADE SURG 15 STRL SS (BLADE) ×2
CANISTER SUCTION 2500CC (MISCELLANEOUS) ×2 IMPLANT
CHLORAPREP W/TINT 26ML (MISCELLANEOUS) ×2 IMPLANT
CLIP TI MEDIUM 6 (CLIP) ×4 IMPLANT
CLIP TI WIDE RED SMALL 6 (CLIP) ×6 IMPLANT
DISSECTOR ROUND CHERRY 3/8 STR (MISCELLANEOUS) IMPLANT
DRAPE PED LAPAROTOMY (DRAPES) ×2 IMPLANT
DRESSING SURGICEL FIBRLLR 1X2 (HEMOSTASIS) ×1 IMPLANT
DRSG SURGICEL FIBRILLAR 1X2 (HEMOSTASIS) ×2
ELECT REM PT RETURN 9FT ADLT (ELECTROSURGICAL) ×2
ELECTRODE REM PT RTRN 9FT ADLT (ELECTROSURGICAL) ×1 IMPLANT
GAUZE SPONGE 4X4 12PLY STRL (GAUZE/BANDAGES/DRESSINGS) ×1 IMPLANT
GAUZE SPONGE 4X4 16PLY XRAY LF (GAUZE/BANDAGES/DRESSINGS) ×2 IMPLANT
GLOVE SURG ORTHO 8.0 STRL STRW (GLOVE) ×2 IMPLANT
GOWN STRL REUS W/TWL XL LVL3 (GOWN DISPOSABLE) ×4 IMPLANT
KIT BASIN OR (CUSTOM PROCEDURE TRAY) ×2 IMPLANT
NS IRRIG 1000ML POUR BTL (IV SOLUTION) ×2 IMPLANT
PACK BASIC VI WITH GOWN DISP (CUSTOM PROCEDURE TRAY) ×2 IMPLANT
PENCIL BUTTON HOLSTER BLD 10FT (ELECTRODE) ×2 IMPLANT
SHEARS HARMONIC 9CM CVD (BLADE) ×2 IMPLANT
STAPLER VISISTAT 35W (STAPLE) IMPLANT
STRIP CLOSURE SKIN 1/2X4 (GAUZE/BANDAGES/DRESSINGS) ×2 IMPLANT
SUT MNCRL AB 4-0 PS2 18 (SUTURE) ×2 IMPLANT
SUT SILK 2 0 (SUTURE)
SUT SILK 2-0 18XBRD TIE 12 (SUTURE) IMPLANT
SUT SILK 3 0 (SUTURE)
SUT SILK 3-0 18XBRD TIE 12 (SUTURE) IMPLANT
SUT VIC AB 3-0 SH 18 (SUTURE) ×4 IMPLANT
SYR BULB IRRIGATION 50ML (SYRINGE) ×2 IMPLANT
TAPE CLOTH SOFT 2X10 (GAUZE/BANDAGES/DRESSINGS) ×1 IMPLANT
TAPE STRIPS DRAPE STRL (GAUZE/BANDAGES/DRESSINGS) ×1 IMPLANT
TOWEL OR 17X26 10 PK STRL BLUE (TOWEL DISPOSABLE) ×2 IMPLANT
TOWEL OR NON WOVEN STRL DISP B (DISPOSABLE) ×2 IMPLANT
YANKAUER SUCT BULB TIP 10FT TU (MISCELLANEOUS) ×2 IMPLANT

## 2014-09-11 NOTE — H&P (View-Only) (Signed)
General Surgery [facility]  Christena Deem. Rochin 08/09/2014 4:29 PM Location: [facility] Patient #: [id number] DOB: 1961-01-05 Married / Language: Cleophus Molt / Race: White Male  History of Present Illness [name] MD; 08/09/2014 5:46 PM) Patient words: thyroid f/u.  The patient is a 53 year old male who presents with a thyroid nodule. Patient is referred by Dr. Delrae Rend for evaluation of thyroid nodule with atypia.  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 Incidental finding was made of a thyroid nodule. In May 2015 the patient underwent a thyroid ultrasound showing a solitary solid nodule in the right thyroid lobe measuring 3.6 cm in size. Subsequent fine-needle aspiration biopsy was performed in September 2015. This shows a follicular lesion with cytologic atypia, class III, 3.6 cm in diameter. Patient is referred for consideration of right thyroidectomy for definitive diagnosis.  Patient has had no prior surgery on the head or neck. He has never been on thyroid medication. There is no family history of endocrine disease and specifically no history of endocrine neoplasms.  Patient describes mild compressive symptoms including chronic cough.   Other Problems Marjean Donna, CMA; 08/09/2014 4:29 PM) Back Pain Melanoma Thyroid Disease  Past Surgical History ([place]; 08/09/2014 4:29 PM) Knee Surgery Right. Spinal Surgery - Lower Back  Diagnostic Studies History Marjean Donna, CMA; 08/09/2014 4:29 PM) Colonoscopy 1-5 years ago  Allergies Marjean Donna, [place]; 08/09/2014 4:29 PM) No Known Drug Allergies10/11/2013  Medication History Marjean Donna, CMA; 08/09/2014 4:29 PM) No Current Medications  Social History Marjean Donna, [place]; 08/09/2014 4:29 PM) Alcohol use Occasional alcohol use. Caffeine use Carbonated beverages. No drug use Tobacco use Never smoker.  Family  History Marjean Donna, [place]; 08/09/2014 4:29 PM) Breast Cancer Family Members In General. Colon Polyps Mother. Melanoma Father.  Review of Systems ([place]; 08/09/2014 4:29 PM) General Present- Fatigue. Not Present- Appetite Loss, Chills, Fever, Night Sweats, Weight Gain and Weight Loss. Skin Not Present- Change in Wart/Mole, Dryness, Hives, Jaundice, New Lesions, Non-Healing Wounds, Rash and Ulcer. HEENT Present- Hoarseness. Not Present- Earache, Hearing Loss, Nose Bleed, Oral Ulcers, Ringing in the Ears, Seasonal Allergies, Sinus Pain, Sore Throat, Visual Disturbances, Wears glasses/contact lenses and Yellow Eyes. Respiratory Present- Chronic Cough, Difficulty Breathing and Wheezing. Not Present- Bloody sputum and Snoring. Breast Not Present- Breast Mass, Breast Pain, Nipple Discharge and Skin Changes. Cardiovascular Not Present- Chest Pain, Difficulty Breathing Lying Down, Leg Cramps, Palpitations, Rapid Heart Rate, Shortness of Breath and Swelling of Extremities. Gastrointestinal Present- Difficulty Swallowing and Indigestion. Not Present- Abdominal Pain, Bloating, Bloody Stool, Change in Bowel Habits, Chronic diarrhea, Constipation, Excessive gas, Gets full quickly at meals, Hemorrhoids, Nausea, Rectal Pain and Vomiting. Male Genitourinary Not Present- Blood in Urine, Change in Urinary Stream, Frequency, Impotence, Nocturia, Painful Urination, Urgency and Urine Leakage. Musculoskeletal Present- Back Pain, Joint Pain and Joint Stiffness. Not Present- Muscle Pain, Muscle Weakness and Swelling of Extremities. Neurological Present- Decreased Memory. Not Present- Fainting, Headaches, Numbness, Seizures, Tingling, Tremor, Trouble walking and Weakness. Psychiatric Present- Change in Sleep Pattern. Not Present- Anxiety, Bipolar, Depression, Fearful and Frequent crying. Endocrine Not Present- Cold Intolerance, Excessive Hunger, Hair Changes, Heat Intolerance, Hot flashes and New  Diabetes. Hematology Present- Gland problems. Not Present- Easy Bruising, Excessive bleeding, HIV and Persistent Infections.   Vitals (Sonya Bynum CMA; 08/09/2014 4:30 PM) 08/09/2014 4:30 PM Weight: 210 lb Height: 70in Body Surface Area: 2.17 m Body Mass Index:  30.13 kg/m Temp.: 24F(Temporal)  Pulse: 64 (Regular)  BP: 118/72 (Sitting, Left Arm, Standard)    Physical Exam [name] MD; 08/09/2014 5:47 PM) The physical exam findings are as follows: Note:General - appears comfortable, no distress; not diaphorectic  HEENT - normocephalic; sclerae clear, gaze conjugate; mucous membranes moist, dentition good; voice normal  Neck - asymmetric on extension; no palpable anterior or posterior cervical adenopathy; palpation of the left thyroid lobe shows no dominant nor discrete nodules; palpation of the right thyroid lobe shows an obvious smooth 3-4 cm nodule in the central portion of the lobe which is mobile with swallowing and nontender.  Chest - clear bilaterally with rhonchi, rales, or wheeze  Cor - regular rhythm with normal rate; no significant murmur  Ext - non-tender without significant edema or lymphedema  Neuro - grossly intact; no tremor    Assessment & Plan [name] MD; 08/09/2014 5:49 PM) NEOPLASM OF UNCERTAIN BEHAVIOR OF THYROID GLAND (D44.0) Current Plans  I had a lengthy discussion with the patient, his wife, and his mother. We reviewed all of the above information including his ultrasound and pathology report. I provided them with written literature to review at home.  I agree with Dr. Cindra Eves assessment to proceed with right thyroid lobectomy. This would provide for definitive tissue diagnosis and may improve his mild compressive symptoms. We discussed the risk and benefits of the procedure including the possibility of recurrent laryngeal nerve injury or injury to parathyroid glands. We discussed the possibility of malignancy, with the risk been  25-30%, and the potential need for completion thyroidectomy. We discussed the hospital stay and the postoperative recovery to be anticipated.  Patient would like to proceed with surgery in the near future. We will make arrangements at a time convenient for the patient.  The risks and benefits of the procedure have been discussed at length with the patient. The patient understands the proposed procedure, potential alternative treatments, and the course of recovery to be expected. All of the patient's questions have been answered at this time. The patient wishes to proceed with surgery.   Signed by [name], MD (08/09/2014 5:50 PM)

## 2014-09-11 NOTE — Anesthesia Postprocedure Evaluation (Signed)
  Anesthesia Post-op Note  Patient: Douglas [name]  Procedure(s) Performed: Procedure(s): COMPLETION THYROIDECTOMY (N/A)  Patient Location: PACU  Anesthesia Type:General  Level of Consciousness: awake  Airway and Oxygen Therapy: Patient Spontanous Breathing and Patient connected to nasal cannula oxygen  Post-op Pain: mild  Post-op Assessment: Post-op Vital signs reviewed, Patient's Cardiovascular Status Stable, Respiratory Function Stable, Patent Airway, No signs of Nausea or vomiting and Pain level controlled  Post-op Vital Signs: Reviewed and stable  Last Vitals:  Filed Vitals:   09/11/14 1423  BP: 155/88  Pulse:   Temp: 36.8 C  Resp: 16    Complications: No apparent anesthesia complications

## 2014-09-11 NOTE — Op Note (Signed)
Douglas [name], Douglas [name]             ACCOUNT NO.:  [id number]  MEDICAL RECORD NO.:  [id number]  LOCATION:  6314                         FACILITY:  [facility]  PHYSICIAN:  [name], MD      DATE OF BIRTH:  May 08, 1961  DATE OF PROCEDURE:  09/11/2014                              OPERATIVE REPORT   PREOPERATIVE DIAGNOSIS:  Follicular variant of papillary thyroid carcinoma.  POSTOPERATIVE DIAGNOSIS:  Follicular variant of papillary thyroid carcinoma.  PROCEDURE:  Completion thyroidectomy.  SURGEON:  [name], MD, FACS  ANESTHESIA:  General.  ESTIMATED BLOOD LOSS:  Minimal.  PREPARATION:  ChloraPrep.  COMPLICATIONS:  None.  INDICATIONS:  The patient is a 53 year old male who underwent right thyroid lobectomy on August 23, 2014, for a dominant right side thyroid nodule with cytologic atypia.  Final pathology shows a follicular variant of papillary thyroid carcinoma measuring 3.2 cm.  The patient now returns to the operating room for completion thyroidectomy.  BODY OF REPORT:  Procedures done in OR #2 at the [facility].  The patient was brought to the operating room, placed in supine position on the operating room table.  Following administration of general anesthesia, the patient was positioned and then prepped and draped in the usual aseptic fashion.  After ascertaining that an adequate level of anesthesia had been achieved, the previous Kocher incision was reopened with a #15 blade.  Suture material was extracted. There was a small seroma present which was evacuated.  Skin flaps were elevated and a Mahorner self-retaining retractor placed for exposure.  Sutures in the strap muscles in the midline are removed.  Again, a small seroma was evacuated.  Right neck appears empty and normal postoperatively.  Strap muscles were somewhat adherent to the anterior surface of the left thyroid lobe.  They are gently mobilized with blunt dissection.  Left thyroid  lobe was exposed by reflecting the strap muscles laterally.  Venous tributaries were divided between Ligaclips with the Harmonic scalpel.  Using gentle blunt dissection, the left lobe was mobilized.  Superior pole vessels were dissected out and divided individually between small and medium Ligaclips with the Harmonic scalpel.  Gland was rolled anteriorly.  There was a moderate-sized tubercle of Zuckerkandl which extends posteriorly.  This was carefully dissected out and mobilized using the Harmonic scalpel.  The recurrent laryngeal nerve was identified and preserved.  Branches of the inferior thyroid artery were divided between small Ligaclips with the Harmonic scalpel.  Gland was rolled anteriorly and the ligament of Gwenlyn Found was released with the electrocautery.  The thyroid lobe was mobilized up and onto the anterior trachea from which it was completely excised with the Harmonic scalpel.  The left lobe was submitted to Pathology for review.  The neck was irrigated with warm saline and good hemostasis was noted. Fibrillar was placed throughout the operative field.  Strap muscles were reapproximated in the midline with interrupted 3-0 Vicryl sutures. Platysma was closed with interrupted 3-0 Vicryl sutures.  Skin was closed with a running 4-0 Monocryl subcuticular suture.  Wound was washed and dried and benzoin and Steri-Strips were applied.  Sterile dressings were applied.  The patient was awakened from anesthesia and brought  to the recovery room.  The patient tolerated the procedure well.   [name], MD, [facility] Office: [phone]   TMG/MEDQ  D:  09/11/2014  T:  09/11/2014  Job:  [id number]  cc:   Katina Degree, M.D. Fax: [phone]

## 2014-09-11 NOTE — Progress Notes (Signed)
Patient has an implanted spinal stimulator which is turned off.

## 2014-09-11 NOTE — Plan of Care (Signed)
Problem: Phase I Progression Outcomes Goal: Incision/dressings dry and intact Outcome: Completed/Met Date Met:  09/11/14 Goal: Tubes/drains patent Outcome: Not Applicable Date Met:  52/77/82 Goal: Initial discharge plan identified Outcome: Completed/Met Date Met:  09/11/14 Goal: Voiding-avoid urinary catheter unless indicated Outcome: Completed/Met Date Met:  09/11/14 Goal: Vital signs/hemodynamically stable Outcome: Completed/Met Date Met:  09/11/14

## 2014-09-11 NOTE — Anesthesia Preprocedure Evaluation (Addendum)
Anesthesia Evaluation  Patient identified by MRN, date of birth, ID band Patient awake    Reviewed: Allergy & Precautions, H&P , NPO status , Patient's Chart, lab work & pertinent test results  History of Anesthesia Complications Negative for: history of anesthetic complications  Airway Mallampati: II  TM Distance: >3 FB Neck ROM: Full    Dental  (+) Teeth Intact,    Pulmonary neg pulmonary ROS,  breath sounds clear to auscultation        Cardiovascular negative cardio ROS  Rhythm:Regular     Neuro/Psych negative neurological ROS  negative psych ROS   GI/Hepatic Neg liver ROS, GERD-  ,  Endo/Other  Thyroid cancer s/p partial thyroidectomy   Renal/GU negative Renal ROS     Musculoskeletal   Abdominal   Peds  Hematology negative hematology ROS (+)   Anesthesia Other Findings   Reproductive/Obstetrics                            Anesthesia Physical Anesthesia Plan  ASA: II  Anesthesia Plan: General   Post-op Pain Management:    Induction: Intravenous  Airway Management Planned: Oral ETT  Additional Equipment: None  Intra-op Plan:   Post-operative Plan: Extubation in OR  Informed Consent: I have reviewed the patients History and Physical, chart, labs and discussed the procedure including the risks, benefits and alternatives for the proposed anesthesia with the patient or authorized representative who has indicated his/her understanding and acceptance.   Dental advisory given  Plan Discussed with: CRNA and Surgeon  Anesthesia Plan Comments:         Anesthesia Quick Evaluation

## 2014-09-11 NOTE — Brief Op Note (Signed)
09/11/2014  12:36 PM  PATIENT:  Douglas [name]  53 y.o. male  PRE-OPERATIVE DIAGNOSIS: follicular variant of papillary thyroid carcinoma  POST-OPERATIVE DIAGNOSIS:  same  PROCEDURE:  Procedure(s): COMPLETION THYROIDECTOMY (N/A)  SURGEON:  Surgeon(s) and Role:    * Armandina Gemma, MD - Primary  PHYSICIAN ASSISTANT:   ASSISTANTS: none   ANESTHESIA:   general  EBL:  Total I/O In: 1000 [I.V.:1000] Out: -   BLOOD ADMINISTERED:none  DRAINS: none   LOCAL MEDICATIONS USED:  NONE  SPECIMEN:  Excision  DISPOSITION OF SPECIMEN:  PATHOLOGY  COUNTS:  YES  TOURNIQUET:  * No tourniquets in log *  DICTATION: .Other Dictation: Dictation Number [id number]  PLAN OF CARE: Admit for overnight observation  PATIENT DISPOSITION:  PACU - hemodynamically stable.   Delay start of Pharmacological VTE agent (>24hrs) due to surgical blood loss or risk of bleeding: yes  [name], MD, [facility] Office: [phone]

## 2014-09-11 NOTE — Interval H&P Note (Signed)
History and Physical Interval Note:  09/11/2014 10:50 AM  Douglas [name]  has presented today for surgery, with the diagnosis of papillary thyroid carcinoma.  Patient has undergone right thyroid lobectomy for diagnosis.  The various methods of treatment have been discussed with the patient and family. After consideration of risks, benefits and other options for treatment, the patient has consented to    Procedure(s): COMPLETION THYROIDECTOMY (N/A) as a surgical intervention .    The patient's history has been reviewed, patient examined, no change in status, stable for surgery.  I have reviewed the patient's chart and labs.  Questions were answered to the patient's satisfaction.    [name], MD, [facility] Office: [place]

## 2014-09-11 NOTE — Transfer of Care (Signed)
Immediate Anesthesia Transfer of Care Note  Patient: Douglas [name]  Procedure(s) Performed: Procedure(s): COMPLETION THYROIDECTOMY (N/A)  Patient Location: PACU  Anesthesia Type:General  Level of Consciousness: awake, sedated and patient cooperative  Airway & Oxygen Therapy: Patient Spontanous Breathing and Patient connected to face mask oxygen  Post-op Assessment: Report given to PACU RN and Post -op Vital signs reviewed and stable  Post vital signs: Reviewed and stable  Complications: No apparent anesthesia complications

## 2014-09-12 ENCOUNTER — Encounter (HOSPITAL_COMMUNITY): Payer: Self-pay | Admitting: Surgery

## 2014-09-12 DIAGNOSIS — C73 Malignant neoplasm of thyroid gland: Secondary | ICD-10-CM | POA: Diagnosis not present

## 2014-09-12 LAB — BASIC METABOLIC PANEL
Anion gap: 11 (ref 5–15)
BUN: 13 mg/dL (ref 6–23)
CO2: 27 mEq/L (ref 19–32)
Calcium: 9.2 mg/dL (ref 8.4–10.5)
Chloride: 102 mEq/L (ref 96–112)
Creatinine, Ser: 1.17 mg/dL (ref 0.50–1.35)
GFR calc Af Amer: 81 mL/min — ABNORMAL LOW (ref >90)
GFR, EST NON AFRICAN AMERICAN: 70 mL/min — AB (ref >90)
GLUCOSE: 129 mg/dL — AB (ref 70–99)
POTASSIUM: 4.4 meq/L (ref 3.7–5.3)
SODIUM: 140 meq/L (ref 137–147)

## 2014-09-12 MED ORDER — LIP MEDEX EX OINT
TOPICAL_OINTMENT | CUTANEOUS | Status: AC
  Administered 2014-09-12: 09:00:00
  Filled 2014-09-12: qty 7

## 2014-09-12 MED ORDER — HYDROCODONE-ACETAMINOPHEN 5-325 MG PO TABS: 1.0000 | ORAL_TABLET | ORAL | Status: DC | PRN

## 2014-09-12 NOTE — Progress Notes (Signed)
Discharge instructions and prescriptions given to patient .  Questions answered 

## 2014-09-12 NOTE — Discharge Summary (Signed)
Physician Discharge Summary [facility]  Patient ID: Douglas [name] MRN: [id number] DOB/AGE: [birth year] 53 y.o.  Admit date: 09/11/2014 Discharge date: 09/12/2014  Admission Diagnoses:  Follicular variant of papillary thyroid carcinoma  Discharge Diagnoses:  Principal Problem:   Papillary thyroid carcinoma, follicular variant Active Problems:   Thyroid cancer   Discharged Condition: good  Hospital Course: Patient was admitted for observation following thyroid surgery.  Post op course was uncomplicated.  Pain was well controlled.  Tolerated diet.  Post op calcium level on morning following surgery was 9.2 mg/dl.  Patient was prepared for discharge home on POD#1.  Consults: None  Treatments: surgery: completion thyroidectomy  Discharge Exam: Blood pressure 124/78, pulse 72, temperature 98 F (36.7 C), temperature source Oral, resp. rate 16, height 5\' 11"  (1.803 m), weight 210 lb (95.255 kg), SpO2 95 %. HEENT - clear Neck - wound dry and intact; voice slightly hoarse, no stridor Chest - clear bilaterally Cor - RRR  Disposition: Home  Discharge Instructions    Diet - low sodium heart healthy    Complete by:  As directed      Discharge instructions    Complete by:  As directed   THYROID & PARATHYROID SURGERY - POST OP INSTRUCTIONS  Always review your discharge instruction sheet from the facility where your surgery was performed.  A prescription for pain medication may be given to you upon discharge.  Take your pain medication as prescribed.  If narcotic pain medicine is not needed, then you may take acetaminophen (Tylenol) or ibuprofen (Advil) as needed.  Take your usually prescribed medications unless otherwise directed.  If you need a refill on your pain medication, please contact your pharmacy. They will contact our office to request authorization.  Prescriptions will not be processed after 5 pm or on weekends.  Start with a light diet upon arrival  home, such as soup and crackers or toast.  Be sure to drink plenty of fluids daily.  Resume your normal diet the day after surgery.  Most patients will experience some swelling and bruising on the chest and neck area.  Ice packs will help.  Swelling and bruising can take several days to resolve.   It is common to experience some constipation if taking pain medication after surgery.  Increasing fluid intake and taking a stool softener will usually help or prevent this problem.  A mild laxative (Milk of Magnesia or Miralax) should be taken according to package directions if there are no bowel movements after 48 hours.  You may remove your bandages 24-48 hours after surgery, and you may shower at that time.  You have steri-strips (small skin tapes) in place directly over the incision.  These strips should be left on the skin for 7-10 days and then removed.  You may resume regular (light) daily activities beginning the next day-such as daily self-care, walking, climbing stairs-gradually increasing activities as tolerated.  You may have sexual intercourse when it is comfortable.  Refrain from any heavy lifting or straining until approved by your doctor.  You may drive when you no longer are taking prescription pain medication, you can comfortably wear a seatbelt, and you can safely maneuver your car and apply brakes.  You should see your doctor in the office for a follow-up appointment approximately two to three weeks after your surgery.  Make sure that you call for this appointment within a day or two after you arrive home to insure a convenient appointment time.  WHEN  TO CALL YOUR DOCTOR: -- Fever greater than 101.5 -- Inability to urinate -- Nausea and/or vomiting - persistent -- Extreme swelling or bruising -- Continued bleeding from incision -- Increased pain, redness, or drainage from the incision -- Difficulty swallowing or breathing -- Muscle cramping or spasms -- Numbness or tingling in  hands or around lips  The clinic staff is available to answer your questions during regular business hours.  Please don't hesitate to call and ask to speak to one of the nurses if you have concerns.  [name], MD, [facility] Office: [phone]     Increase activity slowly    Complete by:  As directed      Remove dressing in 24 hours    Complete by:  As directed             Medication List    TAKE these medications        HYDROcodone-acetaminophen 5-325 MG per tablet  Commonly known as:  NORCO/VICODIN  Take 1-2 tablets by mouth every 4 (four) hours as needed for moderate pain.           Follow-up Information    Follow up with [name], MD. Schedule an appointment as soon as possible for a visit in 3 weeks.   Specialty:  General Surgery   Why:  For wound re-check   Contact information:   [place] [address] [phone]       Follow up with KERR,JEFFREY, MD. Schedule an appointment as soon as possible for a visit in 3 weeks.   Specialty:  Endocrinology   Why:  For lab work and evaluation for radioactive iodine treatment   Contact information:   [address] [place] [address] [place], MD, [facility] Office: [phone]   Signed: [name] 09/12/2014, 8:07 AM

## 2014-09-12 NOTE — Plan of Care (Signed)
Problem: Phase I Progression Outcomes Goal: Pain controlled with appropriate interventions Outcome: Completed/Met Date Met:  09/12/14 Goal: OOB as tolerated unless otherwise ordered Outcome: Completed/Met Date Met:  09/12/14  Problem: Phase II Progression Outcomes Goal: Pain controlled Outcome: Completed/Met Date Met:  09/12/14 Goal: Vital signs stable Outcome: Completed/Met Date Met:  09/12/14 Goal: Dressings dry/intact Outcome: Completed/Met Date Met:  09/12/14

## 2014-09-17 ENCOUNTER — Other Ambulatory Visit (HOSPITAL_COMMUNITY): Payer: Self-pay | Admitting: Internal Medicine

## 2014-09-17 ENCOUNTER — Other Ambulatory Visit: Payer: Self-pay | Admitting: Internal Medicine

## 2014-09-17 DIAGNOSIS — C73 Malignant neoplasm of thyroid gland: Secondary | ICD-10-CM

## 2014-10-24 ENCOUNTER — Encounter (HOSPITAL_COMMUNITY)
Admission: RE | Admit: 2014-10-24 | Discharge: 2014-10-24 | Disposition: A | Discharge status: Home / Self Care | Payer: BC Managed Care – PPO | Source: Ambulatory Visit | Attending: Internal Medicine | Admitting: Internal Medicine

## 2014-10-24 DIAGNOSIS — C73 Malignant neoplasm of thyroid gland: Secondary | ICD-10-CM | POA: Diagnosis not present

## 2014-10-24 MED ORDER — THYROTROPIN ALFA 1.1 MG IM SOLR
0.9000 mg | INTRAMUSCULAR | Status: AC
  Administered 2014-10-24: 0.9 mg via INTRAMUSCULAR

## 2014-10-25 ENCOUNTER — Encounter (HOSPITAL_COMMUNITY)
Admission: RE | Admit: 2014-10-25 | Discharge: 2014-10-25 | Disposition: A | Discharge status: Home / Self Care | Payer: BC Managed Care – PPO | Source: Ambulatory Visit | Attending: Internal Medicine | Admitting: Internal Medicine

## 2014-10-25 DIAGNOSIS — C73 Malignant neoplasm of thyroid gland: Secondary | ICD-10-CM | POA: Diagnosis not present

## 2014-10-25 MED ORDER — THYROTROPIN ALFA 1.1 MG IM SOLR
0.9000 mg | INTRAMUSCULAR | Status: AC
  Administered 2014-10-25: 0.9 mg via INTRAMUSCULAR

## 2014-10-26 ENCOUNTER — Encounter (HOSPITAL_COMMUNITY)
Admission: RE | Admit: 2014-10-26 | Discharge: 2014-10-26 | Disposition: A | Discharge status: Home / Self Care | Payer: BC Managed Care – PPO | Source: Ambulatory Visit | Attending: Internal Medicine | Admitting: Internal Medicine

## 2014-10-26 DIAGNOSIS — C73 Malignant neoplasm of thyroid gland: Secondary | ICD-10-CM | POA: Diagnosis not present

## 2014-10-26 MED ORDER — SODIUM IODIDE I 131 CAPSULE
124.9000 | Freq: Once | INTRAVENOUS | Status: AC | PRN
  Administered 2014-10-26: 124.9 via ORAL

## 2014-11-05 ENCOUNTER — Encounter (HOSPITAL_COMMUNITY)
Admission: RE | Admit: 2014-11-05 | Discharge: 2014-11-05 | Disposition: A | Discharge status: Home / Self Care | Payer: BC Managed Care – PPO | Source: Ambulatory Visit | Attending: Internal Medicine | Admitting: Internal Medicine

## 2014-11-05 DIAGNOSIS — C73 Malignant neoplasm of thyroid gland: Secondary | ICD-10-CM | POA: Diagnosis not present

## 2014-11-05 DIAGNOSIS — E890 Postprocedural hypothyroidism: Secondary | ICD-10-CM | POA: Insufficient documentation

## 2014-11-05 DIAGNOSIS — Z85850 Personal history of malignant neoplasm of thyroid: Secondary | ICD-10-CM | POA: Insufficient documentation

## 2015-04-11 ENCOUNTER — Other Ambulatory Visit: Payer: Self-pay | Admitting: Internal Medicine

## 2015-04-11 DIAGNOSIS — C73 Malignant neoplasm of thyroid gland: Secondary | ICD-10-CM

## 2015-04-16 ENCOUNTER — Ambulatory Visit
Admission: RE | Admit: 2015-04-16 | Discharge: 2015-04-16 | Disposition: A | Discharge status: Home / Self Care | Payer: BLUE CROSS/BLUE SHIELD | Source: Ambulatory Visit | Attending: Internal Medicine | Admitting: Internal Medicine

## 2015-04-16 DIAGNOSIS — C73 Malignant neoplasm of thyroid gland: Secondary | ICD-10-CM

## 2015-05-08 IMAGING — NM NM RAI THYROID CANCER W/ THYROGEN
1 series · 1 of 1 positions shown · non-contrast
Comparison: none

CLINICAL DATA: Thyroid cancer. Tumor capsule invasion present.
Largest lesion 3.2 cm. T2 N0

[st static image · 1 of 1 slices shown]
[im 1/1]
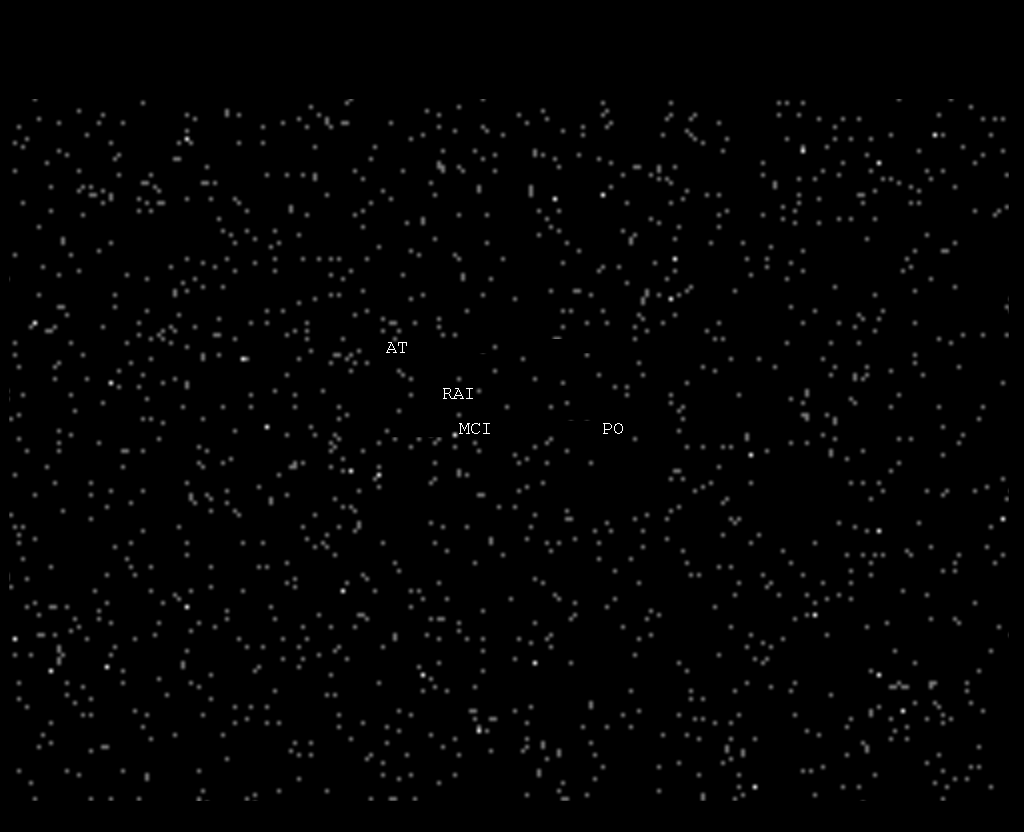

[1 of 1 positions shown; findings below may reference images not displayed]

EXAM:
RADIOACTIVE IODINE THERAPY FOR THYROID CANCER

PROCEDURE:
The risks and benefits of radioactive iodine therapy were discussed
with the patient in detail. Alternative therapies were also
mentioned. Radiation safety was discussed with the patient,
including how to protect the general public from exposure. There
were no barriers to communication. Written consent was obtained. The
patient then received a capsule containing the radiopharmaceutical.

The patient will follow-up with the referring physician.

RADIOPHARMACEUTICALS:  124.9 mCi Z-1L1 sodium iodide
FINDINGS: I 131 therapy for thyroid cancer remnant ablation and adjuvant
therapy.
IMPRESSION: Per oral administration of Z-1L1 sodium iodide for thyroid ablation
and thyroid cancer adjuvant therapy.

## 2016-02-20 DIAGNOSIS — C73 Malignant neoplasm of thyroid gland: Secondary | ICD-10-CM | POA: Diagnosis not present

## 2016-02-24 DIAGNOSIS — E890 Postprocedural hypothyroidism: Secondary | ICD-10-CM | POA: Diagnosis not present

## 2016-04-13 DIAGNOSIS — E890 Postprocedural hypothyroidism: Secondary | ICD-10-CM | POA: Diagnosis not present

## 2016-06-23 DIAGNOSIS — L821 Other seborrheic keratosis: Secondary | ICD-10-CM | POA: Diagnosis not present

## 2016-06-23 DIAGNOSIS — D225 Melanocytic nevi of trunk: Secondary | ICD-10-CM | POA: Diagnosis not present

## 2016-06-23 DIAGNOSIS — Z85828 Personal history of other malignant neoplasm of skin: Secondary | ICD-10-CM | POA: Diagnosis not present

## 2016-06-23 DIAGNOSIS — D1801 Hemangioma of skin and subcutaneous tissue: Secondary | ICD-10-CM | POA: Diagnosis not present

## 2016-06-24 DIAGNOSIS — Z23 Encounter for immunization: Secondary | ICD-10-CM | POA: Diagnosis not present

## 2016-06-24 DIAGNOSIS — E890 Postprocedural hypothyroidism: Secondary | ICD-10-CM | POA: Diagnosis not present

## 2016-06-24 DIAGNOSIS — Z125 Encounter for screening for malignant neoplasm of prostate: Secondary | ICD-10-CM | POA: Diagnosis not present

## 2016-06-24 DIAGNOSIS — E782 Mixed hyperlipidemia: Secondary | ICD-10-CM | POA: Diagnosis not present

## 2016-06-24 DIAGNOSIS — Z0000 Encounter for general adult medical examination without abnormal findings: Secondary | ICD-10-CM | POA: Diagnosis not present

## 2016-08-03 DIAGNOSIS — M720 Palmar fascial fibromatosis [Dupuytren]: Secondary | ICD-10-CM | POA: Diagnosis not present

## 2016-10-07 ENCOUNTER — Other Ambulatory Visit: Payer: Self-pay

## 2016-10-07 DIAGNOSIS — G8918 Other acute postprocedural pain: Secondary | ICD-10-CM | POA: Diagnosis not present

## 2016-10-07 DIAGNOSIS — M79644 Pain in right finger(s): Secondary | ICD-10-CM | POA: Diagnosis not present

## 2016-10-07 DIAGNOSIS — M720 Palmar fascial fibromatosis [Dupuytren]: Secondary | ICD-10-CM | POA: Diagnosis not present

## 2016-10-08 DIAGNOSIS — M720 Palmar fascial fibromatosis [Dupuytren]: Secondary | ICD-10-CM | POA: Diagnosis not present

## 2016-10-14 DIAGNOSIS — M720 Palmar fascial fibromatosis [Dupuytren]: Secondary | ICD-10-CM | POA: Diagnosis not present

## 2016-10-22 DIAGNOSIS — M720 Palmar fascial fibromatosis [Dupuytren]: Secondary | ICD-10-CM | POA: Diagnosis not present

## 2016-10-27 DIAGNOSIS — Z85850 Personal history of malignant neoplasm of thyroid: Secondary | ICD-10-CM | POA: Diagnosis not present

## 2016-10-27 DIAGNOSIS — E890 Postprocedural hypothyroidism: Secondary | ICD-10-CM | POA: Diagnosis not present

## 2016-10-28 DIAGNOSIS — M720 Palmar fascial fibromatosis [Dupuytren]: Secondary | ICD-10-CM | POA: Diagnosis not present

## 2016-11-05 DIAGNOSIS — M720 Palmar fascial fibromatosis [Dupuytren]: Secondary | ICD-10-CM | POA: Diagnosis not present

## 2016-11-10 DIAGNOSIS — Z85850 Personal history of malignant neoplasm of thyroid: Secondary | ICD-10-CM | POA: Diagnosis not present

## 2016-11-10 DIAGNOSIS — R490 Dysphonia: Secondary | ICD-10-CM | POA: Diagnosis not present

## 2016-11-10 DIAGNOSIS — E890 Postprocedural hypothyroidism: Secondary | ICD-10-CM | POA: Diagnosis not present

## 2016-11-12 DIAGNOSIS — M720 Palmar fascial fibromatosis [Dupuytren]: Secondary | ICD-10-CM | POA: Diagnosis not present

## 2016-11-18 DIAGNOSIS — M720 Palmar fascial fibromatosis [Dupuytren]: Secondary | ICD-10-CM | POA: Diagnosis not present

## 2016-12-02 DIAGNOSIS — M720 Palmar fascial fibromatosis [Dupuytren]: Secondary | ICD-10-CM | POA: Diagnosis not present

## 2016-12-09 DIAGNOSIS — M720 Palmar fascial fibromatosis [Dupuytren]: Secondary | ICD-10-CM | POA: Diagnosis not present

## 2016-12-16 DIAGNOSIS — M720 Palmar fascial fibromatosis [Dupuytren]: Secondary | ICD-10-CM | POA: Diagnosis not present

## 2016-12-23 DIAGNOSIS — M720 Palmar fascial fibromatosis [Dupuytren]: Secondary | ICD-10-CM | POA: Diagnosis not present

## 2016-12-25 DIAGNOSIS — Z0100 Encounter for examination of eyes and vision without abnormal findings: Secondary | ICD-10-CM | POA: Diagnosis not present

## 2016-12-30 DIAGNOSIS — M720 Palmar fascial fibromatosis [Dupuytren]: Secondary | ICD-10-CM | POA: Diagnosis not present

## 2017-01-05 DIAGNOSIS — M720 Palmar fascial fibromatosis [Dupuytren]: Secondary | ICD-10-CM | POA: Diagnosis not present

## 2017-01-13 DIAGNOSIS — M720 Palmar fascial fibromatosis [Dupuytren]: Secondary | ICD-10-CM | POA: Diagnosis not present

## 2017-01-20 DIAGNOSIS — M720 Palmar fascial fibromatosis [Dupuytren]: Secondary | ICD-10-CM | POA: Diagnosis not present

## 2017-01-22 DIAGNOSIS — M720 Palmar fascial fibromatosis [Dupuytren]: Secondary | ICD-10-CM | POA: Diagnosis not present

## 2017-01-22 DIAGNOSIS — Z4789 Encounter for other orthopedic aftercare: Secondary | ICD-10-CM | POA: Diagnosis not present

## 2017-02-03 DIAGNOSIS — M720 Palmar fascial fibromatosis [Dupuytren]: Secondary | ICD-10-CM | POA: Diagnosis not present

## 2017-02-10 DIAGNOSIS — M720 Palmar fascial fibromatosis [Dupuytren]: Secondary | ICD-10-CM | POA: Diagnosis not present

## 2017-02-23 DIAGNOSIS — M65341 Trigger finger, right ring finger: Secondary | ICD-10-CM | POA: Diagnosis not present

## 2017-02-23 DIAGNOSIS — Z4789 Encounter for other orthopedic aftercare: Secondary | ICD-10-CM | POA: Diagnosis not present

## 2017-03-30 DIAGNOSIS — M720 Palmar fascial fibromatosis [Dupuytren]: Secondary | ICD-10-CM | POA: Diagnosis not present

## 2017-06-23 DIAGNOSIS — H40023 Open angle with borderline findings, high risk, bilateral: Secondary | ICD-10-CM | POA: Diagnosis not present

## 2017-06-30 DIAGNOSIS — Z0000 Encounter for general adult medical examination without abnormal findings: Secondary | ICD-10-CM | POA: Diagnosis not present

## 2017-06-30 DIAGNOSIS — Z23 Encounter for immunization: Secondary | ICD-10-CM | POA: Diagnosis not present

## 2017-06-30 DIAGNOSIS — E782 Mixed hyperlipidemia: Secondary | ICD-10-CM | POA: Diagnosis not present

## 2017-07-01 DIAGNOSIS — M720 Palmar fascial fibromatosis [Dupuytren]: Secondary | ICD-10-CM | POA: Diagnosis not present

## 2017-11-08 DIAGNOSIS — E890 Postprocedural hypothyroidism: Secondary | ICD-10-CM | POA: Diagnosis not present

## 2017-11-08 DIAGNOSIS — Z85850 Personal history of malignant neoplasm of thyroid: Secondary | ICD-10-CM | POA: Diagnosis not present

## 2017-11-11 DIAGNOSIS — Z85850 Personal history of malignant neoplasm of thyroid: Secondary | ICD-10-CM | POA: Diagnosis not present

## 2017-11-11 DIAGNOSIS — E890 Postprocedural hypothyroidism: Secondary | ICD-10-CM | POA: Diagnosis not present

## 2017-11-11 DIAGNOSIS — R079 Chest pain, unspecified: Secondary | ICD-10-CM | POA: Diagnosis not present

## 2017-11-11 DIAGNOSIS — R0602 Shortness of breath: Secondary | ICD-10-CM | POA: Diagnosis not present

## 2017-11-12 ENCOUNTER — Ambulatory Visit
Admission: RE | Admit: 2017-11-12 | Discharge: 2017-11-12 | Disposition: A | Discharge status: Home / Self Care | Payer: BLUE CROSS/BLUE SHIELD | Source: Ambulatory Visit | Attending: Internal Medicine | Admitting: Internal Medicine

## 2017-11-12 ENCOUNTER — Other Ambulatory Visit: Payer: Self-pay | Admitting: Internal Medicine

## 2017-11-12 DIAGNOSIS — R0789 Other chest pain: Secondary | ICD-10-CM

## 2017-11-12 DIAGNOSIS — R079 Chest pain, unspecified: Secondary | ICD-10-CM | POA: Diagnosis not present

## 2018-01-11 DIAGNOSIS — H40023 Open angle with borderline findings, high risk, bilateral: Secondary | ICD-10-CM | POA: Diagnosis not present

## 2018-04-26 DIAGNOSIS — G894 Chronic pain syndrome: Secondary | ICD-10-CM | POA: Diagnosis not present

## 2018-04-26 DIAGNOSIS — M792 Neuralgia and neuritis, unspecified: Secondary | ICD-10-CM | POA: Diagnosis not present

## 2018-04-26 DIAGNOSIS — M5416 Radiculopathy, lumbar region: Secondary | ICD-10-CM | POA: Diagnosis not present

## 2018-04-26 DIAGNOSIS — Z4542 Encounter for adjustment and management of neuropacemaker (brain) (peripheral nerve) (spinal cord): Secondary | ICD-10-CM | POA: Diagnosis not present

## 2018-05-25 DIAGNOSIS — Z4542 Encounter for adjustment and management of neuropacemaker (brain) (peripheral nerve) (spinal cord): Secondary | ICD-10-CM | POA: Diagnosis not present

## 2018-05-25 DIAGNOSIS — G894 Chronic pain syndrome: Secondary | ICD-10-CM | POA: Diagnosis not present

## 2018-05-25 IMAGING — DX DG CHEST 2V
2 series · 2 of 2 positions shown · non-contrast
Comparison: CT chest dated March 05, 2014. Chest x-ray dated January 15, 2009.

CLINICAL DATA: Chest pain for the past several weeks.

EXAM:
CHEST  2 VIEW

[dg chest 2 view (1 of 2)]
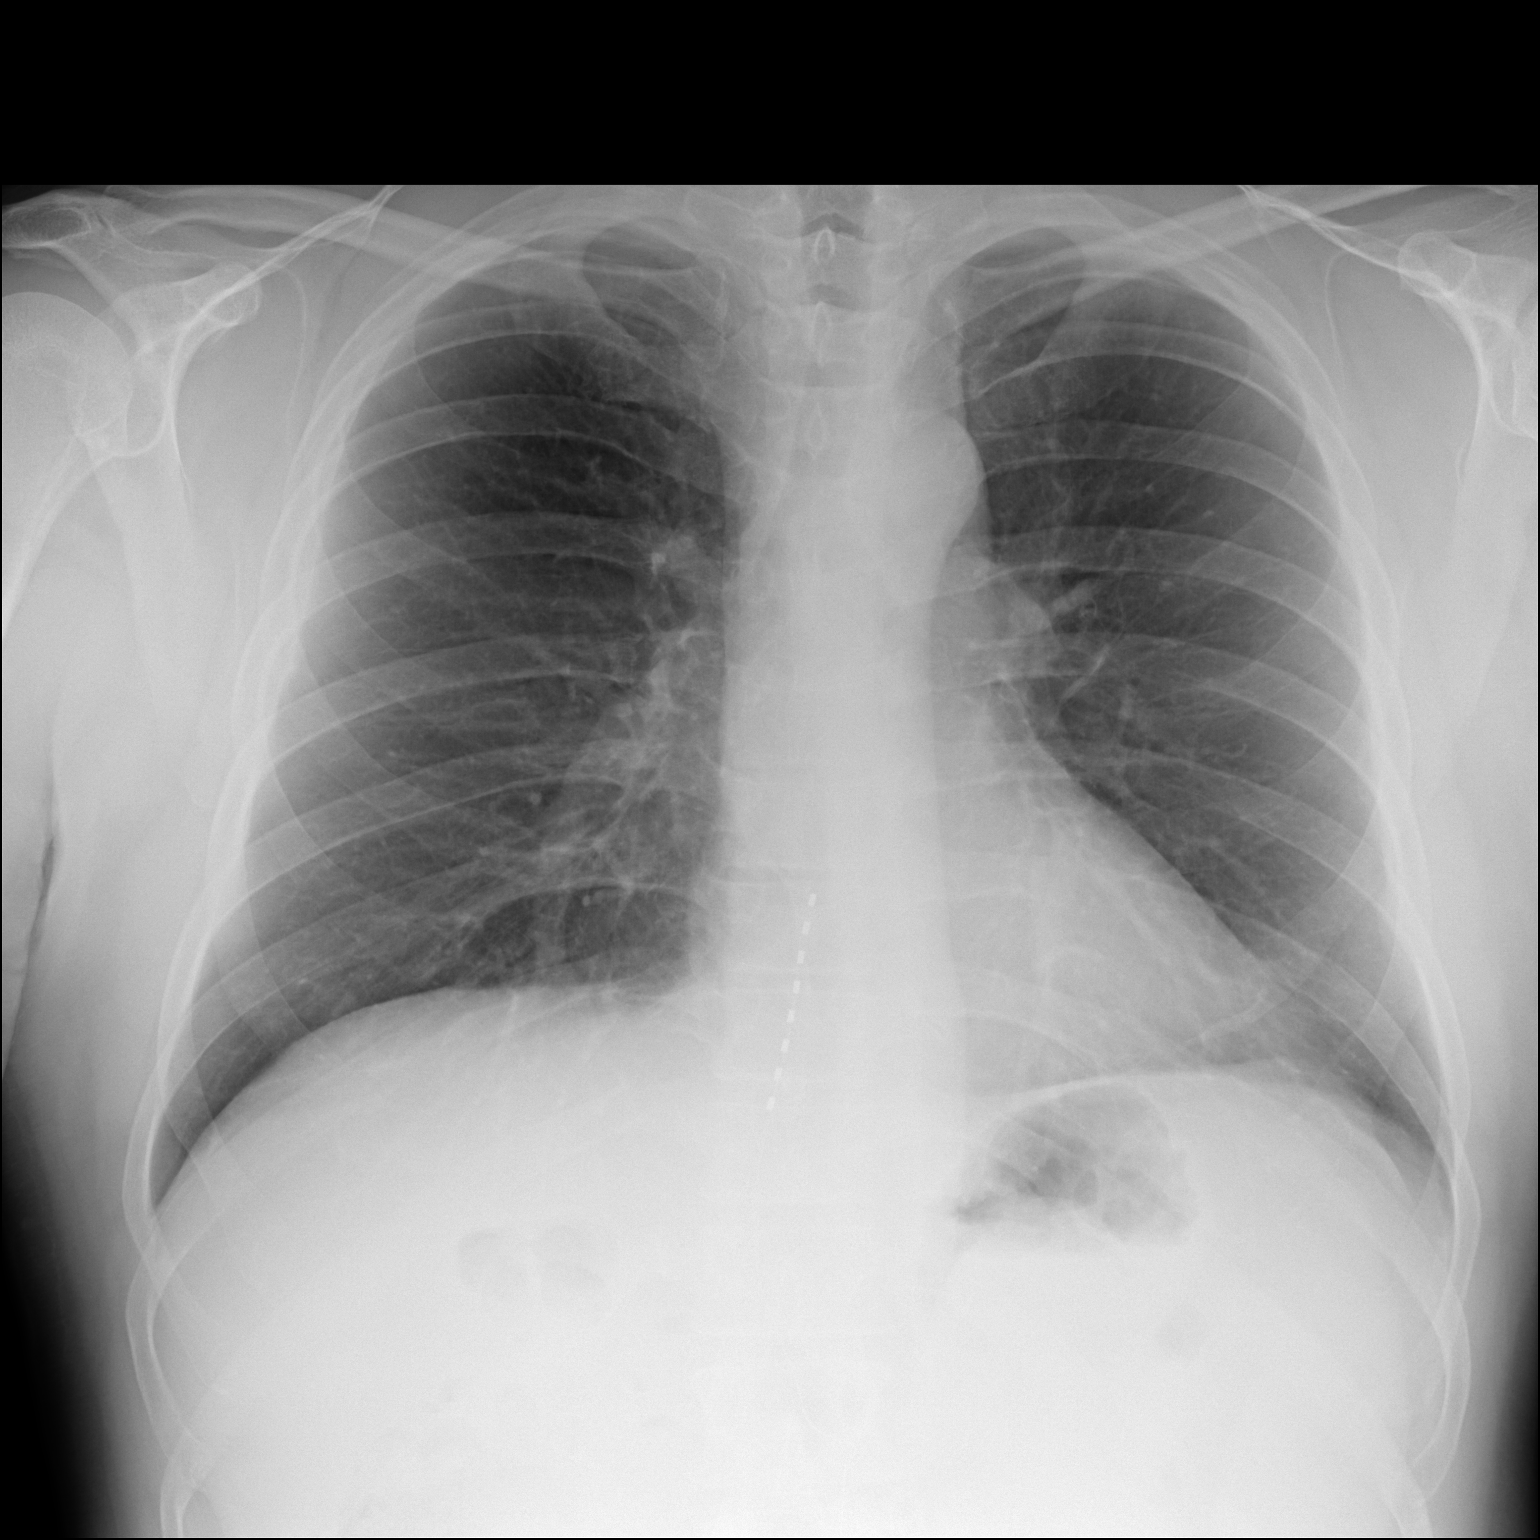

[dg chest 2 view (2 of 2)]
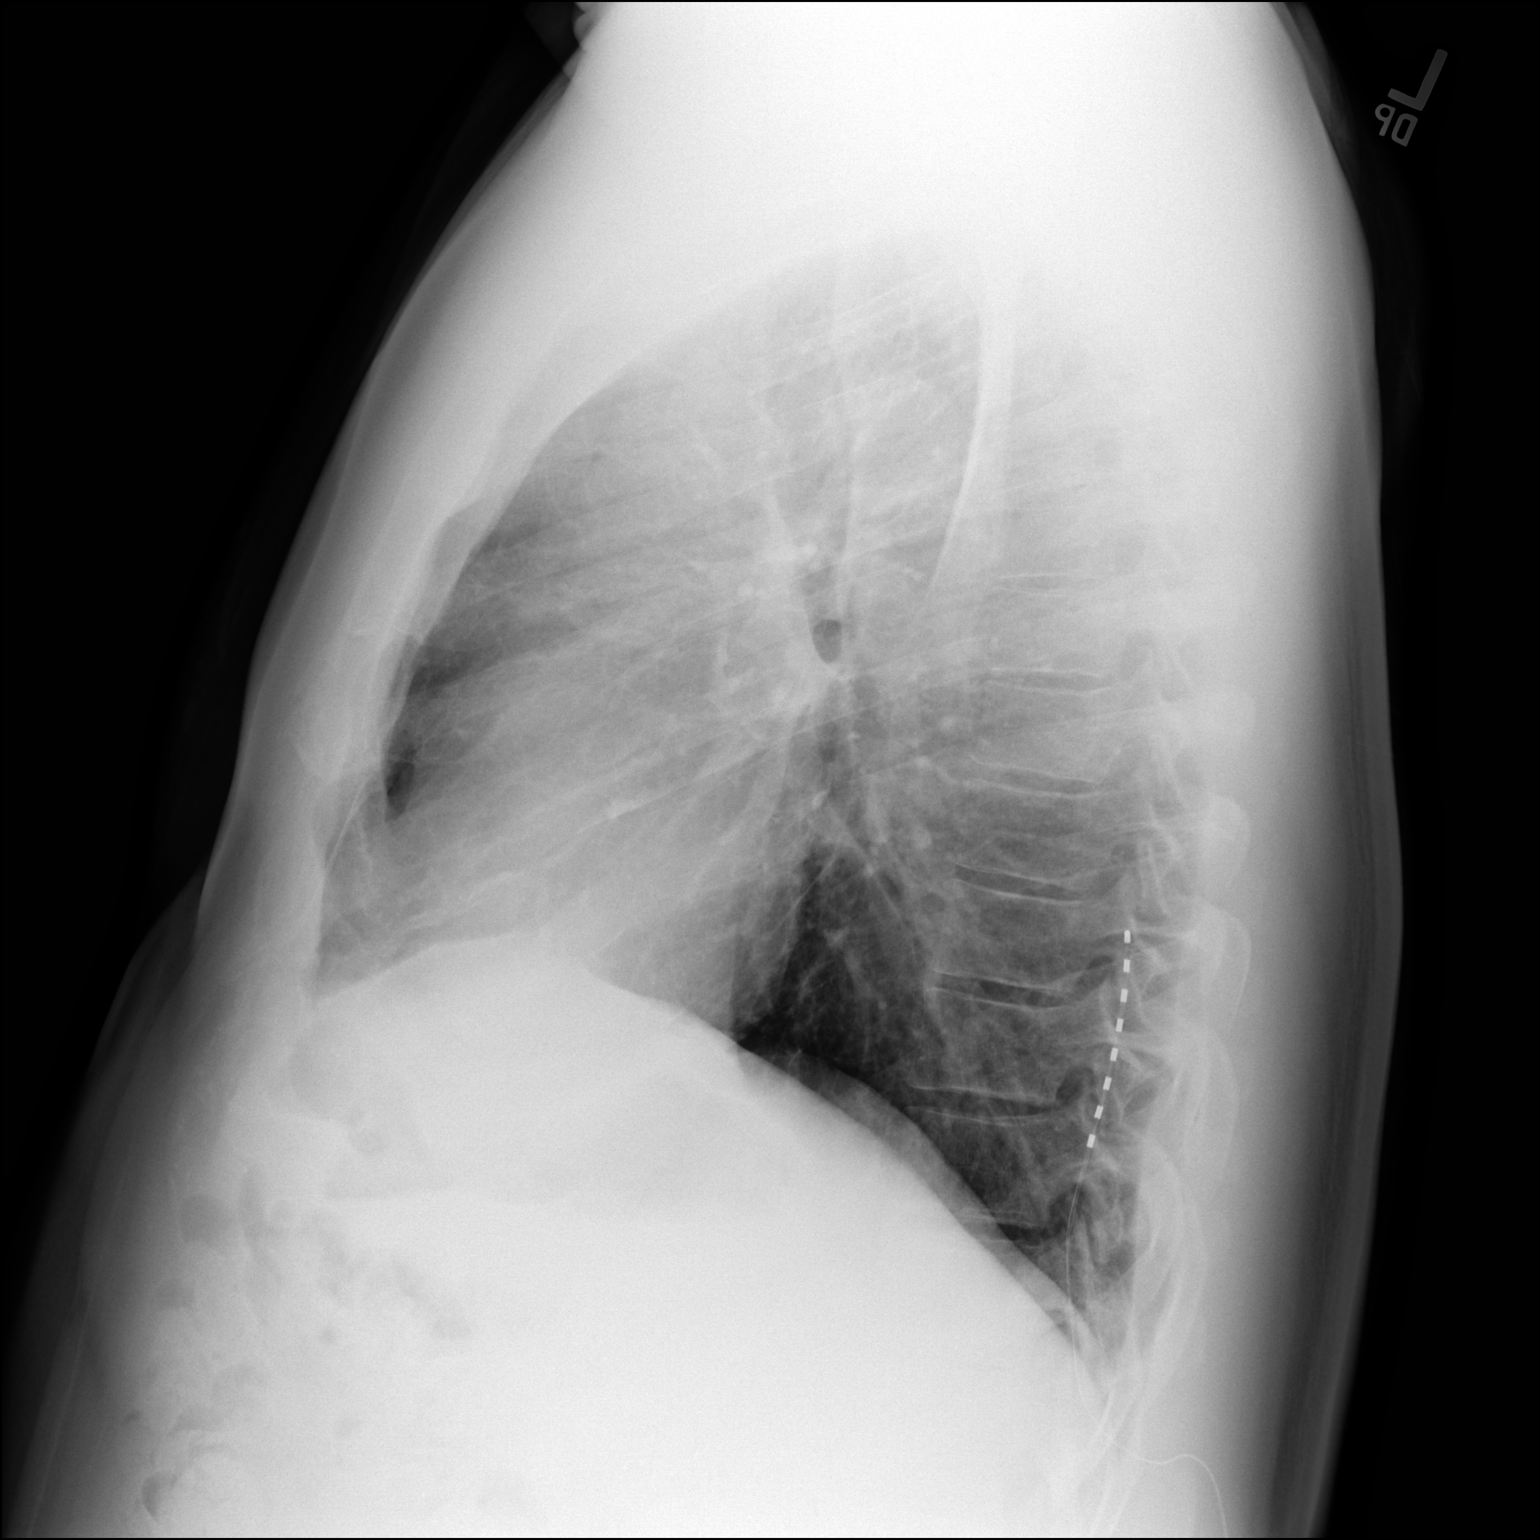

[2 of 2 positions shown; findings below may reference images not displayed]

FINDINGS: The heart size and mediastinal contours are within normal limits.
Both lungs are clear. The visualized skeletal structures are
unremarkable. Spinal neurostimulator again noted. Prior
thyroidectomy.
IMPRESSION: No active cardiopulmonary disease.

## 2018-06-09 DIAGNOSIS — G894 Chronic pain syndrome: Secondary | ICD-10-CM | POA: Diagnosis not present

## 2018-06-09 DIAGNOSIS — M792 Neuralgia and neuritis, unspecified: Secondary | ICD-10-CM | POA: Diagnosis not present

## 2018-06-14 DIAGNOSIS — G894 Chronic pain syndrome: Secondary | ICD-10-CM | POA: Diagnosis not present

## 2018-06-14 DIAGNOSIS — M792 Neuralgia and neuritis, unspecified: Secondary | ICD-10-CM | POA: Diagnosis not present

## 2018-07-14 DIAGNOSIS — I10 Essential (primary) hypertension: Secondary | ICD-10-CM | POA: Diagnosis not present

## 2018-07-14 DIAGNOSIS — E786 Lipoprotein deficiency: Secondary | ICD-10-CM | POA: Diagnosis not present

## 2018-07-14 DIAGNOSIS — Z85850 Personal history of malignant neoplasm of thyroid: Secondary | ICD-10-CM | POA: Diagnosis not present

## 2018-07-14 DIAGNOSIS — Z0000 Encounter for general adult medical examination without abnormal findings: Secondary | ICD-10-CM | POA: Diagnosis not present

## 2018-07-14 DIAGNOSIS — Z23 Encounter for immunization: Secondary | ICD-10-CM | POA: Diagnosis not present

## 2018-07-15 DIAGNOSIS — Z0000 Encounter for general adult medical examination without abnormal findings: Secondary | ICD-10-CM | POA: Diagnosis not present

## 2018-07-15 DIAGNOSIS — I10 Essential (primary) hypertension: Secondary | ICD-10-CM | POA: Diagnosis not present

## 2018-07-19 DIAGNOSIS — H40023 Open angle with borderline findings, high risk, bilateral: Secondary | ICD-10-CM | POA: Diagnosis not present

## 2018-11-11 DIAGNOSIS — Z85850 Personal history of malignant neoplasm of thyroid: Secondary | ICD-10-CM | POA: Diagnosis not present

## 2018-11-11 DIAGNOSIS — E890 Postprocedural hypothyroidism: Secondary | ICD-10-CM | POA: Diagnosis not present

## 2018-11-17 DIAGNOSIS — Z85850 Personal history of malignant neoplasm of thyroid: Secondary | ICD-10-CM | POA: Diagnosis not present

## 2018-11-17 DIAGNOSIS — E890 Postprocedural hypothyroidism: Secondary | ICD-10-CM | POA: Diagnosis not present

## 2018-11-17 DIAGNOSIS — Z23 Encounter for immunization: Secondary | ICD-10-CM | POA: Diagnosis not present

## 2019-01-25 DIAGNOSIS — H40023 Open angle with borderline findings, high risk, bilateral: Secondary | ICD-10-CM | POA: Diagnosis not present

## 2019-08-24 DIAGNOSIS — R899 Unspecified abnormal finding in specimens from other organs, systems and tissues: Secondary | ICD-10-CM | POA: Diagnosis not present

## 2019-08-24 DIAGNOSIS — R7989 Other specified abnormal findings of blood chemistry: Secondary | ICD-10-CM | POA: Diagnosis not present

## 2019-10-09 DIAGNOSIS — E890 Postprocedural hypothyroidism: Secondary | ICD-10-CM | POA: Diagnosis not present

## 2019-11-16 DIAGNOSIS — Z85850 Personal history of malignant neoplasm of thyroid: Secondary | ICD-10-CM | POA: Diagnosis not present

## 2019-11-16 DIAGNOSIS — E890 Postprocedural hypothyroidism: Secondary | ICD-10-CM | POA: Diagnosis not present

## 2019-11-20 ENCOUNTER — Ambulatory Visit: Payer: BC Managed Care – PPO | Attending: Internal Medicine

## 2019-11-20 DIAGNOSIS — Z20822 Contact with and (suspected) exposure to covid-19: Secondary | ICD-10-CM

## 2019-11-21 DIAGNOSIS — Z20828 Contact with and (suspected) exposure to other viral communicable diseases: Secondary | ICD-10-CM | POA: Diagnosis not present

## 2019-11-22 LAB — NOVEL CORONAVIRUS, NAA: SARS-CoV-2, NAA: DETECTED — AB

## 2020-03-07 DIAGNOSIS — H40013 Open angle with borderline findings, low risk, bilateral: Secondary | ICD-10-CM | POA: Diagnosis not present

## 2020-06-12 DIAGNOSIS — M2550 Pain in unspecified joint: Secondary | ICD-10-CM | POA: Diagnosis not present

## 2020-06-12 DIAGNOSIS — M7989 Other specified soft tissue disorders: Secondary | ICD-10-CM | POA: Diagnosis not present

## 2020-06-12 DIAGNOSIS — M5136 Other intervertebral disc degeneration, lumbar region: Secondary | ICD-10-CM | POA: Diagnosis not present

## 2020-06-12 DIAGNOSIS — Z6835 Body mass index (BMI) 35.0-35.9, adult: Secondary | ICD-10-CM | POA: Diagnosis not present

## 2020-07-24 DIAGNOSIS — Z0000 Encounter for general adult medical examination without abnormal findings: Secondary | ICD-10-CM | POA: Diagnosis not present

## 2020-07-24 DIAGNOSIS — Z23 Encounter for immunization: Secondary | ICD-10-CM | POA: Diagnosis not present

## 2020-07-24 DIAGNOSIS — E782 Mixed hyperlipidemia: Secondary | ICD-10-CM | POA: Diagnosis not present

## 2020-07-24 DIAGNOSIS — Z125 Encounter for screening for malignant neoplasm of prostate: Secondary | ICD-10-CM | POA: Diagnosis not present

## 2020-07-24 DIAGNOSIS — R7301 Impaired fasting glucose: Secondary | ICD-10-CM | POA: Diagnosis not present

## 2020-07-24 DIAGNOSIS — N289 Disorder of kidney and ureter, unspecified: Secondary | ICD-10-CM | POA: Diagnosis not present

## 2020-09-17 DIAGNOSIS — H5203 Hypermetropia, bilateral: Secondary | ICD-10-CM | POA: Diagnosis not present

## 2020-09-17 DIAGNOSIS — H401131 Primary open-angle glaucoma, bilateral, mild stage: Secondary | ICD-10-CM | POA: Diagnosis not present

## 2020-10-10 DIAGNOSIS — H401131 Primary open-angle glaucoma, bilateral, mild stage: Secondary | ICD-10-CM | POA: Diagnosis not present

## 2021-02-13 DIAGNOSIS — H401131 Primary open-angle glaucoma, bilateral, mild stage: Secondary | ICD-10-CM | POA: Diagnosis not present

## 2021-06-17 DIAGNOSIS — H401131 Primary open-angle glaucoma, bilateral, mild stage: Secondary | ICD-10-CM | POA: Diagnosis not present

## 2021-07-28 DIAGNOSIS — R5383 Other fatigue: Secondary | ICD-10-CM | POA: Diagnosis not present

## 2021-07-28 DIAGNOSIS — E782 Mixed hyperlipidemia: Secondary | ICD-10-CM | POA: Diagnosis not present

## 2021-07-28 DIAGNOSIS — Z13220 Encounter for screening for lipoid disorders: Secondary | ICD-10-CM | POA: Diagnosis not present

## 2021-07-28 DIAGNOSIS — I10 Essential (primary) hypertension: Secondary | ICD-10-CM | POA: Diagnosis not present

## 2021-07-28 DIAGNOSIS — R7301 Impaired fasting glucose: Secondary | ICD-10-CM | POA: Diagnosis not present

## 2021-07-28 DIAGNOSIS — Z125 Encounter for screening for malignant neoplasm of prostate: Secondary | ICD-10-CM | POA: Diagnosis not present

## 2021-07-28 DIAGNOSIS — N1831 Chronic kidney disease, stage 3a: Secondary | ICD-10-CM | POA: Diagnosis not present

## 2021-07-28 DIAGNOSIS — E890 Postprocedural hypothyroidism: Secondary | ICD-10-CM | POA: Diagnosis not present

## 2021-07-28 DIAGNOSIS — Z0000 Encounter for general adult medical examination without abnormal findings: Secondary | ICD-10-CM | POA: Diagnosis not present

## 2021-08-11 DIAGNOSIS — Z85850 Personal history of malignant neoplasm of thyroid: Secondary | ICD-10-CM | POA: Diagnosis not present

## 2021-08-11 DIAGNOSIS — E890 Postprocedural hypothyroidism: Secondary | ICD-10-CM | POA: Diagnosis not present

## 2021-10-23 DIAGNOSIS — H401131 Primary open-angle glaucoma, bilateral, mild stage: Secondary | ICD-10-CM | POA: Diagnosis not present

## 2021-10-23 DIAGNOSIS — H40013 Open angle with borderline findings, low risk, bilateral: Secondary | ICD-10-CM | POA: Diagnosis not present

## 2022-01-09 DIAGNOSIS — R6882 Decreased libido: Secondary | ICD-10-CM | POA: Diagnosis not present

## 2022-01-09 DIAGNOSIS — R5383 Other fatigue: Secondary | ICD-10-CM | POA: Diagnosis not present

## 2022-01-09 DIAGNOSIS — E291 Testicular hypofunction: Secondary | ICD-10-CM | POA: Diagnosis not present

## 2022-01-12 DIAGNOSIS — R6882 Decreased libido: Secondary | ICD-10-CM | POA: Diagnosis not present

## 2022-01-12 DIAGNOSIS — E291 Testicular hypofunction: Secondary | ICD-10-CM | POA: Diagnosis not present

## 2022-01-12 DIAGNOSIS — R5383 Other fatigue: Secondary | ICD-10-CM | POA: Diagnosis not present

## 2022-02-03 DIAGNOSIS — E291 Testicular hypofunction: Secondary | ICD-10-CM | POA: Diagnosis not present

## 2022-02-03 DIAGNOSIS — Z79899 Other long term (current) drug therapy: Secondary | ICD-10-CM | POA: Diagnosis not present

## 2022-02-24 DIAGNOSIS — Z5181 Encounter for therapeutic drug level monitoring: Secondary | ICD-10-CM | POA: Diagnosis not present

## 2022-02-24 DIAGNOSIS — E291 Testicular hypofunction: Secondary | ICD-10-CM | POA: Diagnosis not present

## 2022-03-29 ENCOUNTER — Ambulatory Visit (HOSPITAL_COMMUNITY)
Admission: EM | Admit: 2022-03-29 | Discharge: 2022-03-29 | Disposition: A | Discharge status: Home / Self Care | Payer: BC Managed Care – PPO

## 2022-03-29 ENCOUNTER — Other Ambulatory Visit: Payer: Self-pay

## 2022-03-29 ENCOUNTER — Encounter (HOSPITAL_COMMUNITY): Payer: Self-pay | Admitting: Emergency Medicine

## 2022-03-29 ENCOUNTER — Emergency Department (HOSPITAL_COMMUNITY): Payer: BC Managed Care – PPO

## 2022-03-29 ENCOUNTER — Emergency Department (HOSPITAL_COMMUNITY)
Admission: EM | Admit: 2022-03-29 | Discharge: 2022-03-29 | Disposition: A | Discharge status: Home / Self Care | Payer: BC Managed Care – PPO | Attending: Emergency Medicine | Admitting: Emergency Medicine

## 2022-03-29 DIAGNOSIS — S61012A Laceration without foreign body of left thumb without damage to nail, initial encounter: Secondary | ICD-10-CM | POA: Insufficient documentation

## 2022-03-29 DIAGNOSIS — W312XXA Contact with powered woodworking and forming machines, initial encounter: Secondary | ICD-10-CM | POA: Diagnosis not present

## 2022-03-29 DIAGNOSIS — S62522A Displaced fracture of distal phalanx of left thumb, initial encounter for closed fracture: Secondary | ICD-10-CM | POA: Diagnosis not present

## 2022-03-29 DIAGNOSIS — Z23 Encounter for immunization: Secondary | ICD-10-CM | POA: Diagnosis not present

## 2022-03-29 DIAGNOSIS — R000 Tachycardia, unspecified: Secondary | ICD-10-CM | POA: Insufficient documentation

## 2022-03-29 DIAGNOSIS — Z85850 Personal history of malignant neoplasm of thyroid: Secondary | ICD-10-CM | POA: Diagnosis not present

## 2022-03-29 DIAGNOSIS — S61112A Laceration without foreign body of left thumb with damage to nail, initial encounter: Secondary | ICD-10-CM

## 2022-03-29 DIAGNOSIS — S6992XA Unspecified injury of left wrist, hand and finger(s), initial encounter: Secondary | ICD-10-CM | POA: Diagnosis not present

## 2022-03-29 MED ORDER — CEPHALEXIN 250 MG PO CAPS
500.0000 mg | ORAL_CAPSULE | Freq: Once | ORAL | Status: AC
  Administered 2022-03-29: 500 mg via ORAL
  Filled 2022-03-29: qty 2

## 2022-03-29 MED ORDER — CEPHALEXIN 500 MG PO CAPS: 500.0000 mg | ORAL_CAPSULE | Freq: Four times a day (QID) | ORAL | 0 refills | Status: AC

## 2022-03-29 MED ORDER — LIDOCAINE HCL (PF) 1 % IJ SOLN
10.0000 mL | Freq: Once | INTRAMUSCULAR | Status: AC
  Administered 2022-03-29: 10 mL via INTRADERMAL
  Filled 2022-03-29: qty 10

## 2022-03-29 MED ORDER — FENTANYL CITRATE PF 50 MCG/ML IJ SOSY: 50.0000 ug | PREFILLED_SYRINGE | Freq: Once | INTRAMUSCULAR | Status: DC

## 2022-03-29 MED ORDER — FENTANYL CITRATE PF 50 MCG/ML IJ SOSY
50.0000 ug | PREFILLED_SYRINGE | Freq: Once | INTRAMUSCULAR | Status: AC
  Administered 2022-03-29: 50 ug via INTRAMUSCULAR
  Filled 2022-03-29: qty 1

## 2022-03-29 MED ORDER — LIDOCAINE HCL (PF) 1 % IJ SOLN
INTRAMUSCULAR | Status: AC
  Filled 2022-03-29: qty 5

## 2022-03-29 MED ORDER — OXYCODONE HCL 5 MG PO TABS: 5.0000 mg | ORAL_TABLET | ORAL | 0 refills | Status: AC | PRN

## 2022-03-29 MED ORDER — TETANUS-DIPHTH-ACELL PERTUSSIS 5-2.5-18.5 LF-MCG/0.5 IM SUSY
0.5000 mL | PREFILLED_SYRINGE | Freq: Once | INTRAMUSCULAR | Status: AC
Start: 2022-03-29 — End: 2022-03-29
  Administered 2022-03-29: 0.5 mL via INTRAMUSCULAR
  Filled 2022-03-29: qty 0.5

## 2022-03-29 NOTE — ED Provider Notes (Signed)
Pt seen in triage. Cut thumb on table saw today.   On exam. Is a laceration that cut a full thickness hunk of the side of the thumb, plus a little nail. Proximal skin edge retracted some. Not actively bleeding at this time.  I have asked him to go to the ER, for definitive treatment, in case he may need specialty care for wound closure.   Barrett Henle, MD 03/29/22 [phone]

## 2022-03-29 NOTE — ED Notes (Signed)
Escorted to car 

## 2022-03-29 NOTE — ED Notes (Signed)
Patient is being discharged from the Urgent Care and sent to the Emergency Department via pov . Per dr Windy Carina, patient is in need of higher level of care due to limited resources for complex injury. Patient is aware and verbalizes understanding of plan of care.  Vitals:   03/29/22 1414  BP: 131/89  Pulse: (!) 110  Resp: (!) 24  Temp: 98.6 F (37 C)  SpO2: 95%

## 2022-03-29 NOTE — ED Provider Notes (Signed)
[facility] EMERGENCY DEPARTMENT Provider Note   CSN: [id number] Arrival date & time: 03/29/22  1423     History  Chief Complaint  Patient presents with   thumb lac    Douglas [name] is a 61 y.o. male with a history of papillary thyroid carcinoma s/p thyroidectomy deceased February 06, 2014) presenting to the ED with a laceration to the left thumb.  Patient states that he was using a table saw to cut wood when it slipped, causing a laceration to his left thumb.  He denies any numbness or tingling.  Continues to have full range of motion of the thumb.  He is not sure of his last tetanus dose.  HPI     Home Medications Prior to Admission medications   Medication Sig Start Date End Date Taking? Authorizing Provider  cephALEXin (KEFLEX) 500 MG capsule Take 1 capsule (500 mg total) by mouth 4 (four) times daily for 7 days. 03/29/22 04/05/22 Yes Sondra Come, MD  oxyCODONE (ROXICODONE) 5 MG immediate release tablet Take 1 tablet (5 mg total) by mouth every 4 (four) hours as needed for up to 3 days for severe pain. 03/29/22 04/01/22 Yes Sondra Come, MD  amLODipine (NORVASC) 10 MG tablet Take 10 mg by mouth daily. 01/01/22   [provider]  levothyroxine (SYNTHROID) 112 MCG tablet 1 tablet in the morning on an empty stomach 05/31/18   [provider]  Testosterone 20.25 MG/ACT (1.62%) GEL SMARTSIG:2 pump Topical Daily 03/15/22   [provider]      Allergies    Patient has no known allergies.    Review of Systems   Review of Systems  Skin:  Positive for wound.   Physical Exam Updated Vital Signs BP (!) 140/100   Pulse 99   Temp 98.5 F (36.9 C)   Resp 18   SpO2 100%  Physical Exam HENT:     Head: Normocephalic and atraumatic.     Nose: Nose normal.  Cardiovascular:     Rate and Rhythm: Regular rhythm. Tachycardia present.     Pulses: Normal pulses.  Pulmonary:     Effort: Pulmonary effort is normal. No respiratory distress.   Musculoskeletal:     Cervical back: Neck supple.     Comments: He has full extension of the left thumb. Slightly decreased range of motion on flexion, but due to pain.  Skin:    General: Skin is warm and dry.     Comments: Laceration over the medial edge of the medial edge of the left thumb nail with partial loss of the nail. Laceration extends to the midline pad of the left thumb.  Neurological:     General: No focal deficit present.     Mental Status: He is alert and oriented to person, place, and time.     Comments: Sensation is intact over the tip of the left thumb.    ED Results / Procedures / Treatments   Labs (all labs ordered are listed, but only abnormal results are displayed) Labs Reviewed - No data to display  EKG None  Radiology DG Finger Thumb Left  Result Date: 03/29/2022 CLINICAL DATA:  Injury from saw. EXAM: LEFT THUMB 2+V COMPARISON:  None Available. FINDINGS: There is soft tissue laceration and obliquely oriented fracture through the lateral base of the first distal phalanx. No evidence for dislocation. Joint spaces well maintained. IMPRESSION: 1. Soft tissue laceration and obliquely oriented fracture through the lateral aspect of the first distal phalanx. Electronically Signed  By: Ronney Asters M.D.   On: 03/29/2022 15:29    Procedures .Marland KitchenLaceration Repair  Date/Time: 03/29/2022 6:42 PM Performed by: Sondra Come, MD Authorized by: Wyvonnia Dusky, MD   Consent:    Consent obtained:  Verbal   Consent given by:  Patient   Risks, benefits, and alternatives were discussed: yes     Risks discussed:  Infection, pain, need for additional repair and poor cosmetic result   Alternatives discussed:  No treatment Anesthesia:    Anesthesia method:  Nerve block   Block anesthetic:  Lidocaine 1% w/o epi   Block technique:  Digital nerve block, left thumb   Block injection procedure:  Introduced needle, negative aspiration for blood and anatomic landmarks  palpated   Block outcome:  Anesthesia achieved Laceration details:    Location:  Finger   Finger location:  L thumb   Length (cm):  3 Pre-procedure details:    Preparation:  Patient was prepped and draped in usual sterile fashion Exploration:    Limited defect created (wound extended): no     Hemostasis achieved with:  Direct pressure (quick clot gauze)   Imaging obtained: x-ray     Imaging outcome: foreign body not noted     Wound extent: underlying fracture     Contaminated: no   Treatment:    Area cleansed with:  Saline and povidone-iodine   Amount of cleaning:  Extensive   Irrigation solution:  Sterile water   Irrigation method:  Syringe   Debridement:  None   Undermining:  None Skin repair:    Repair method:  Sutures   Suture size:  5-0 and 4-0   Suture material:  Prolene   Suture technique:  Simple interrupted   Number of sutures:  3 Approximation:    Approximation:  Loose Repair type:    Repair type:  Simple Post-procedure details:    Dressing:  Sterile dressing   Procedure completion:  Tolerated    Medications Ordered in ED Medications  lidocaine (PF) (XYLOCAINE) 1 % injection (has no administration in time range)  lidocaine (PF) (XYLOCAINE) 1 % injection (has no administration in time range)  lidocaine (PF) (XYLOCAINE) 1 % injection 10 mL (10 mLs Intradermal Given 03/29/22 1544)  Tdap (BOOSTRIX) injection 0.5 mL (0.5 mLs Intramuscular Given 03/29/22 1544)  fentaNYL (SUBLIMAZE) injection 50 mcg (50 mcg Intramuscular Given 03/29/22 1545)  cephALEXin (KEFLEX) capsule 500 mg (500 mg Oral Given 03/29/22 1547)    ED Course/ Medical Decision Making/ A&P Clinical Course as of 03/29/22 1845  Sun Mar 29, 2022  1541 61 yo male presenting with thumb laceration to left thumb from table saw. Oblique fx through distal phalanx. Will start on keflex. [MT]    Clinical Course User Index [MT] Trifan, Carola Rhine, MD                           Medical Decision Making Amount  and/or Complexity of Data Reviewed Radiology: ordered.  Risk Prescription drug management.   Douglas [name] is a 61 y.o. male with a history of papillary thyroid carcinoma s/p thyroidectomy deceased 01-30-14) presenting to the ED with a laceration to the left thumb.  On exam, the patient is afebrile, tachycardic (likely due to pain), but normotensive. He has a laceration to the left thumb involving the medial aspect of the left thumb nail and extending onto the pad of the thumb. Sensation is intact. Extension of the thumb intact. Flexion of  the thumb at the distal joint is mildly decreased 2/2 pain.  XR of the left hand obtained which showed an oblique fracture through the lateral base of the first distal phalanx.  Given the concern for open fracture, patient was given a dose of Keflex while in the ED.  We will also discharged with a prescription for Keflex x7 days.  There is a large amount of skin loss over the medial aspect of the left nail and not amenable to closure.  The laceration along the pad of the thumb was partially closed as per the procedure note above.  Quick clot gauze was placed over the wound to help with bleeding control and a sterile dressing was placed.  Patient was instructed to follow-up with hand surgery within the next week for reevaluation.  He was also given a prescription for oxycodone for severe pain.  Strict return precautions were discussed and the patient was discharged home in stable condition.  Final Clinical Impression(s) / ED Diagnoses Final diagnoses:  Laceration of left thumb without foreign body with damage to nail, initial encounter    Rx / DC Orders ED Discharge Orders          Ordered    oxyCODONE (ROXICODONE) 5 MG immediate release tablet  Every 4 hours PRN        03/29/22 1717    cephALEXin (KEFLEX) 500 MG capsule  4 times daily        03/29/22 1717              Sondra Come, MD 03/29/22 1845    Wyvonnia Dusky, MD 03/29/22  [phone]

## 2022-03-29 NOTE — Discharge Instructions (Addendum)
Your x-ray today showed: Soft tissue laceration and obliquely oriented fracture through the lateral aspect of the first distal phalanx.  Please schedule follow-up with hand surgery within the next week. Start Keflex, 500 mg 4 times daily x7 days. Alternate Tylenol and Motrin every 3-4 hours as needed for pain. He can also take oxycodone, 1 tablet every 4 hours as needed for severe pain.  If you develop fevers, redness extending over the wrist/arm, or any other concerning symptoms, please return to the emergency department.

## 2022-03-29 NOTE — ED Triage Notes (Addendum)
Left thumb injury working with a chain saw.  Laceration to left thumb with partial avulsion of tissue.     Patient has a spinal cord stimulator

## 2022-03-29 NOTE — ED Notes (Signed)
Dr Windy Carina in in take room

## 2022-03-29 NOTE — ED Notes (Signed)
Patient discharged instructions reviewed with the patient. The patient verbalized understanding of instructions. Patient discharged.

## 2022-03-29 NOTE — ED Triage Notes (Signed)
Laceration to L thumb with partial involvement of nail from a saw this afternoon.  DT <10 years.  Pt sent from [facility].

## 2022-03-30 DIAGNOSIS — S61012A Laceration without foreign body of left thumb without damage to nail, initial encounter: Secondary | ICD-10-CM | POA: Diagnosis not present

## 2022-04-08 DIAGNOSIS — S61012D Laceration without foreign body of left thumb without damage to nail, subsequent encounter: Secondary | ICD-10-CM | POA: Diagnosis not present

## 2022-04-20 DIAGNOSIS — S61012D Laceration without foreign body of left thumb without damage to nail, subsequent encounter: Secondary | ICD-10-CM | POA: Diagnosis not present

## 2022-05-01 DIAGNOSIS — H401131 Primary open-angle glaucoma, bilateral, mild stage: Secondary | ICD-10-CM | POA: Diagnosis not present

## 2022-05-04 DIAGNOSIS — S61012D Laceration without foreign body of left thumb without damage to nail, subsequent encounter: Secondary | ICD-10-CM | POA: Diagnosis not present

## 2022-05-20 DIAGNOSIS — S61012D Laceration without foreign body of left thumb without damage to nail, subsequent encounter: Secondary | ICD-10-CM | POA: Diagnosis not present

## 2022-07-09 ENCOUNTER — Encounter (HOSPITAL_COMMUNITY): Payer: Self-pay

## 2022-07-09 ENCOUNTER — Ambulatory Visit (HOSPITAL_COMMUNITY)
Admission: EM | Admit: 2022-07-09 | Discharge: 2022-07-09 | Disposition: A | Discharge status: Home / Self Care | Payer: BC Managed Care – PPO | Attending: Family Medicine | Admitting: Family Medicine

## 2022-07-09 DIAGNOSIS — L255 Unspecified contact dermatitis due to plants, except food: Secondary | ICD-10-CM

## 2022-07-09 MED ORDER — PREDNISONE 10 MG (48) PO TBPK: ORAL_TABLET | ORAL | 0 refills | Status: AC

## 2022-07-09 NOTE — ED Triage Notes (Signed)
Pt reports he has poison ivy all over his body.

## 2022-07-09 NOTE — ED Provider Notes (Signed)
  [place]   [id number] 07/09/22 Arrival Time: 1537  ASSESSMENT & PLAN:  1. Rhus dermatitis    No signs of bacterial infection. Begin: Meds ordered this encounter  Medications   predniSONE (STERAPRED UNI-PAK 48 TAB) 10 MG (48) TBPK tablet    Sig: Take as directed.    Dispense:  48 tablet    Refill:  0   Benadryl if needed. Will follow up with PCP or here if worsening or failing to improve as anticipated. Reviewed expectations re: course of current medical issues. Questions answered. Outlined signs and symptoms indicating need for more acute intervention. Patient verbalized understanding. After Visit Summary given.   SUBJECTIVE:  Douglas [name] is a 61 y.o. male who presents with a skin complaint. Questions poison ivy. Itchy rash on LE and torso; few days. Benadryl without much help. Afebrile.   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   Dupuytren's contracture of right hand    09-05-14 remains an issue   GERD (gastroesophageal reflux disease)    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"   Social History   Socioeconomic History   Marital status: Married    Spouse name: Not on file   Number of children: Not on file   Years of education: Not on file   Highest education level: Not on file  Occupational History   Not on file  Tobacco Use   Smoking status: Never   Smokeless tobacco: Never   Vaping Use   Vaping Use: Never used  Substance and Sexual Activity   Alcohol use: Yes    Comment: occasional   Drug use: No   Sexual activity: Yes  Other Topics Concern   Not on file  Social History Narrative   Not on file   Social Determinants of Health   Financial Resource Strain: Not on file  Food Insecurity: Not on file  Transportation Needs: Not on file  Physical Activity: Not on file  Stress: Not on file  Social Connections: Not on file  Intimate Partner Violence: Not on file   History reviewed. No pertinent family history. Past Surgical History:  Procedure Laterality Date   implanted bone stimulator  2007   "spinal cord stimulator" remains implanted left buttocks 09-05-14   KNEE ARTHROSCOPY Right 1985   LUMBAR FUSION  2011   L3-L4- retained hardware   MOHS SURGERY Left 2013   shoulder   THYROID LOBECTOMY Right 08/23/2014   Procedure: RIGHT THYROID LOBECTOMY;  Surgeon: Armandina Gemma, MD;  Location: [facility];  Service: General;  Laterality: Right;   THYROIDECTOMY N/A 09/11/2014   Procedure: COMPLETION THYROIDECTOMY;  Surgeon: Armandina Gemma, MD;  Location: [facility];  Service: General;  Laterality: Ginette Pitman, MD 07/09/22 1800

## 2022-07-31 DIAGNOSIS — Z23 Encounter for immunization: Secondary | ICD-10-CM | POA: Diagnosis not present

## 2022-07-31 DIAGNOSIS — I10 Essential (primary) hypertension: Secondary | ICD-10-CM | POA: Diagnosis not present

## 2022-07-31 DIAGNOSIS — R7301 Impaired fasting glucose: Secondary | ICD-10-CM | POA: Diagnosis not present

## 2022-07-31 DIAGNOSIS — E782 Mixed hyperlipidemia: Secondary | ICD-10-CM | POA: Diagnosis not present

## 2022-07-31 DIAGNOSIS — Z0000 Encounter for general adult medical examination without abnormal findings: Secondary | ICD-10-CM | POA: Diagnosis not present

## 2022-08-10 DIAGNOSIS — Z85850 Personal history of malignant neoplasm of thyroid: Secondary | ICD-10-CM | POA: Diagnosis not present

## 2022-08-10 DIAGNOSIS — E890 Postprocedural hypothyroidism: Secondary | ICD-10-CM | POA: Diagnosis not present

## 2022-08-17 DIAGNOSIS — Z125 Encounter for screening for malignant neoplasm of prostate: Secondary | ICD-10-CM | POA: Diagnosis not present

## 2022-08-17 DIAGNOSIS — E291 Testicular hypofunction: Secondary | ICD-10-CM | POA: Diagnosis not present

## 2022-08-17 DIAGNOSIS — E890 Postprocedural hypothyroidism: Secondary | ICD-10-CM | POA: Diagnosis not present

## 2022-08-17 DIAGNOSIS — E785 Hyperlipidemia, unspecified: Secondary | ICD-10-CM | POA: Diagnosis not present

## 2022-08-17 DIAGNOSIS — R82998 Other abnormal findings in urine: Secondary | ICD-10-CM | POA: Diagnosis not present

## 2022-08-27 DIAGNOSIS — H401131 Primary open-angle glaucoma, bilateral, mild stage: Secondary | ICD-10-CM | POA: Diagnosis not present

## 2022-10-09 IMAGING — CR DG FINGER THUMB 2+V*L*
3 series · 3 of 3 positions shown · non-contrast
Comparison: None Available.

CLINICAL DATA: Injury from saw.

EXAM:
LEFT THUMB 2+V

[finger obl]
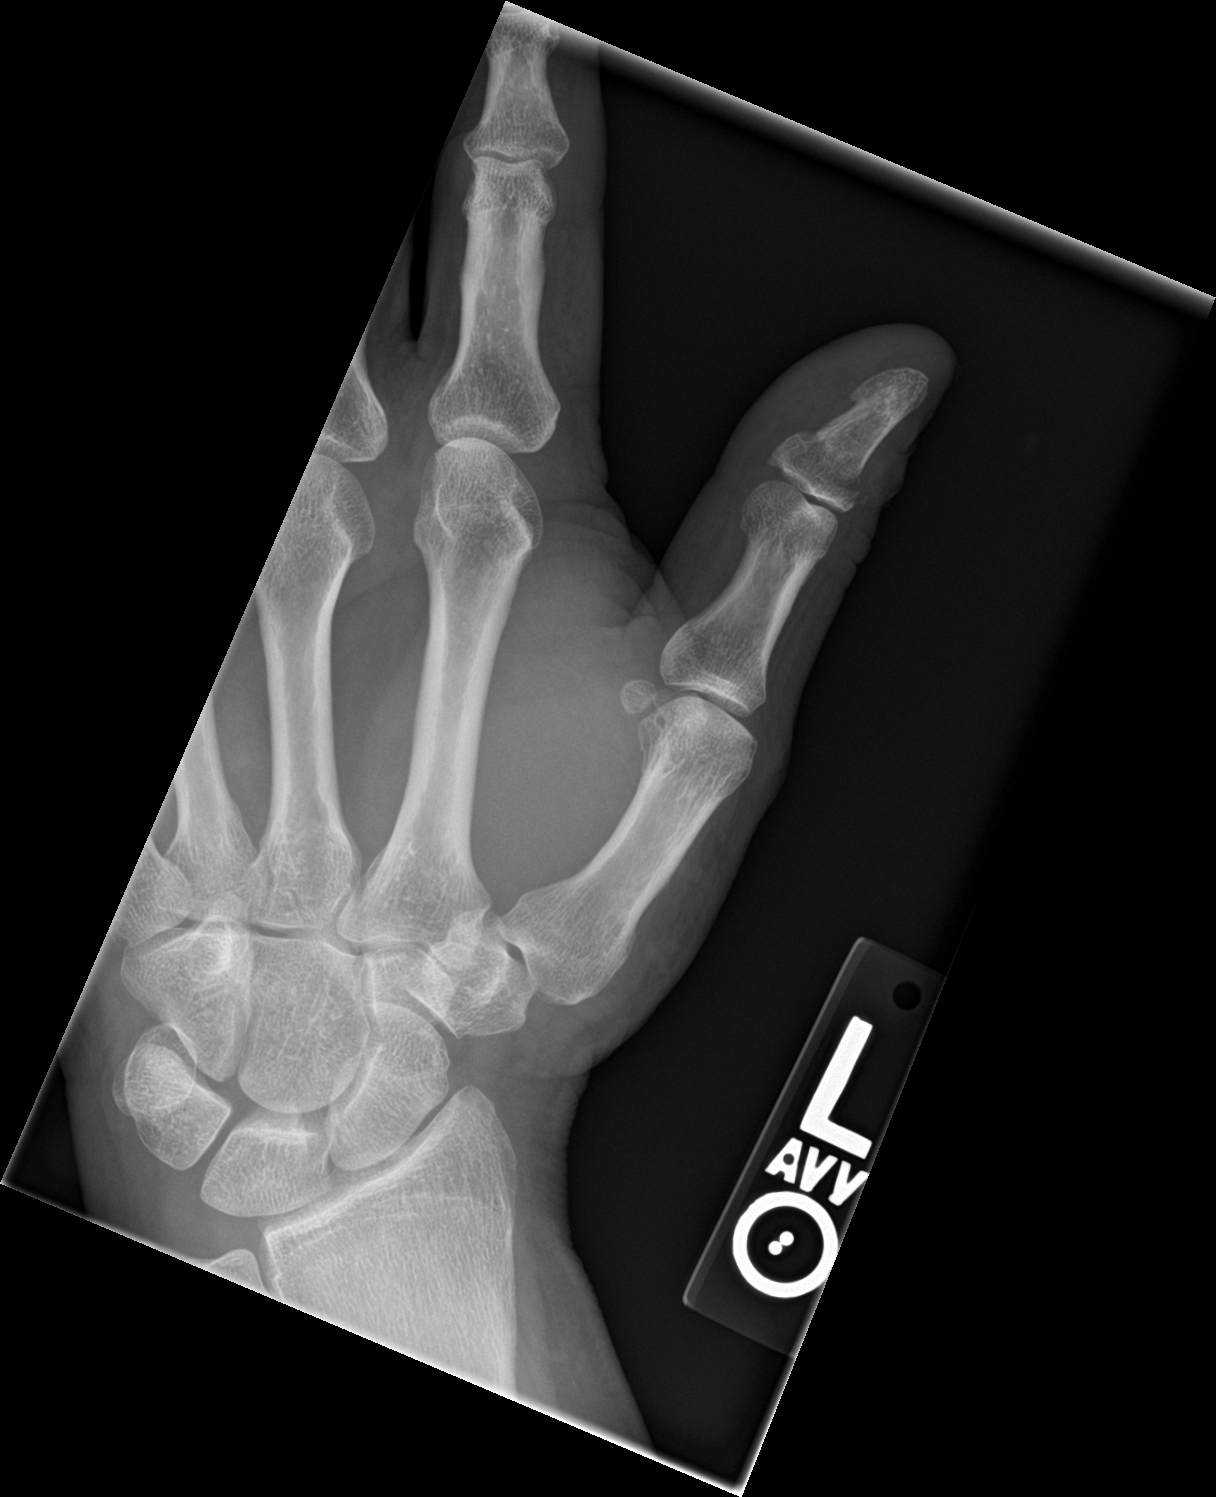

[finger lat]
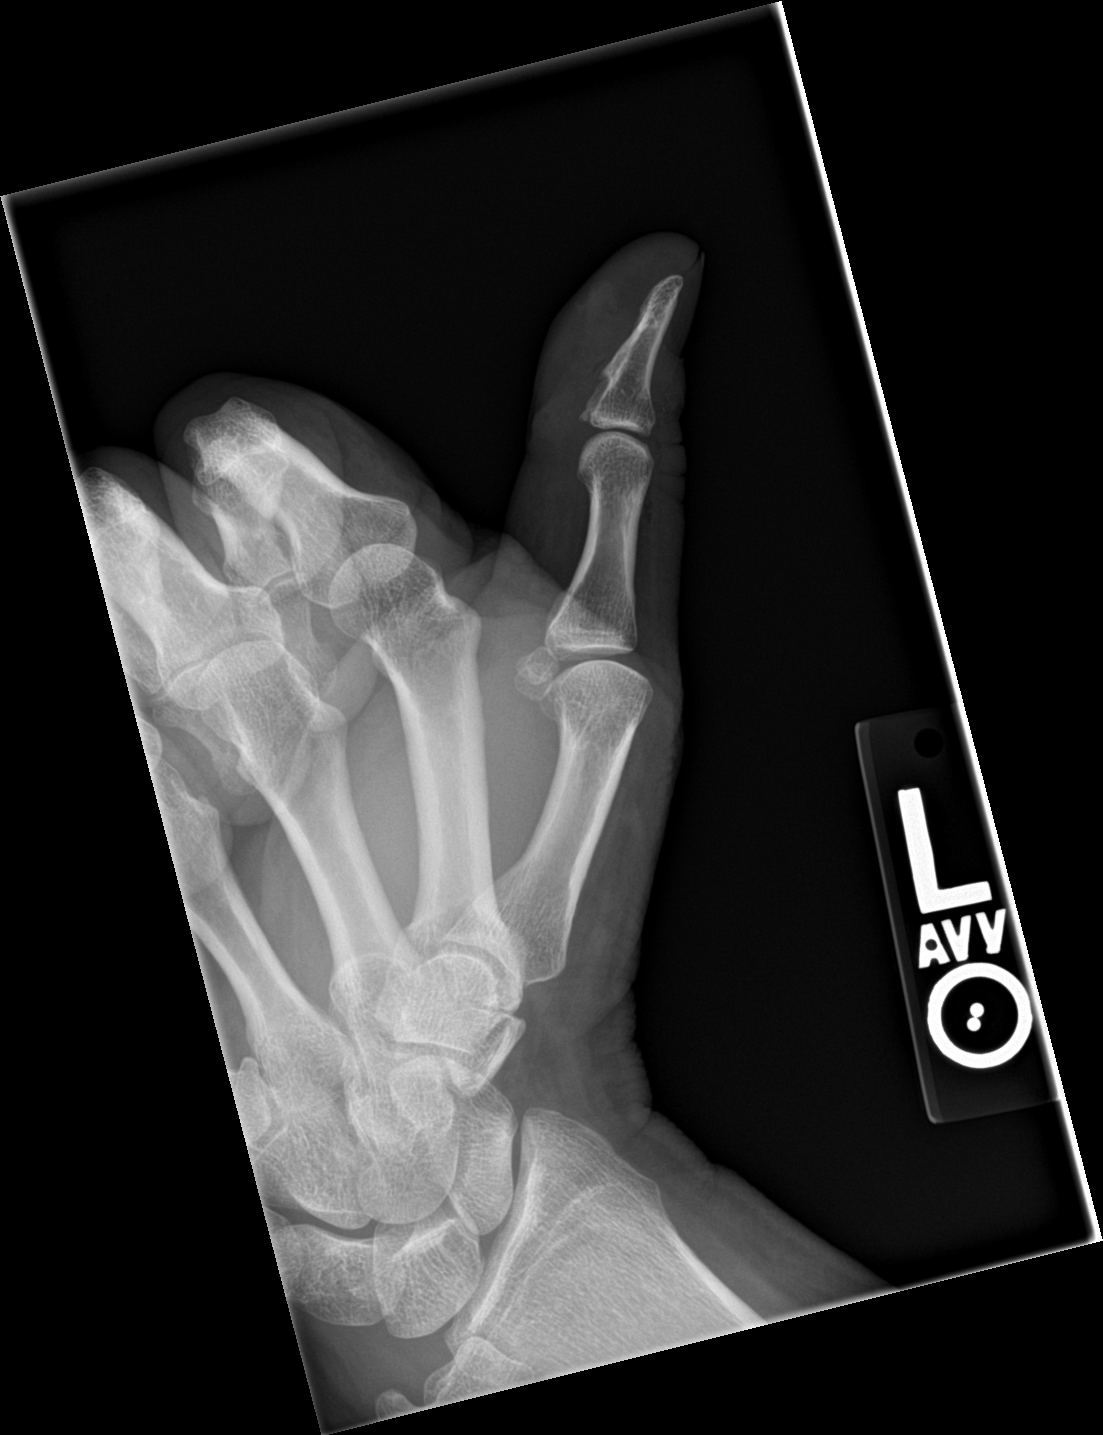

[finger ap]
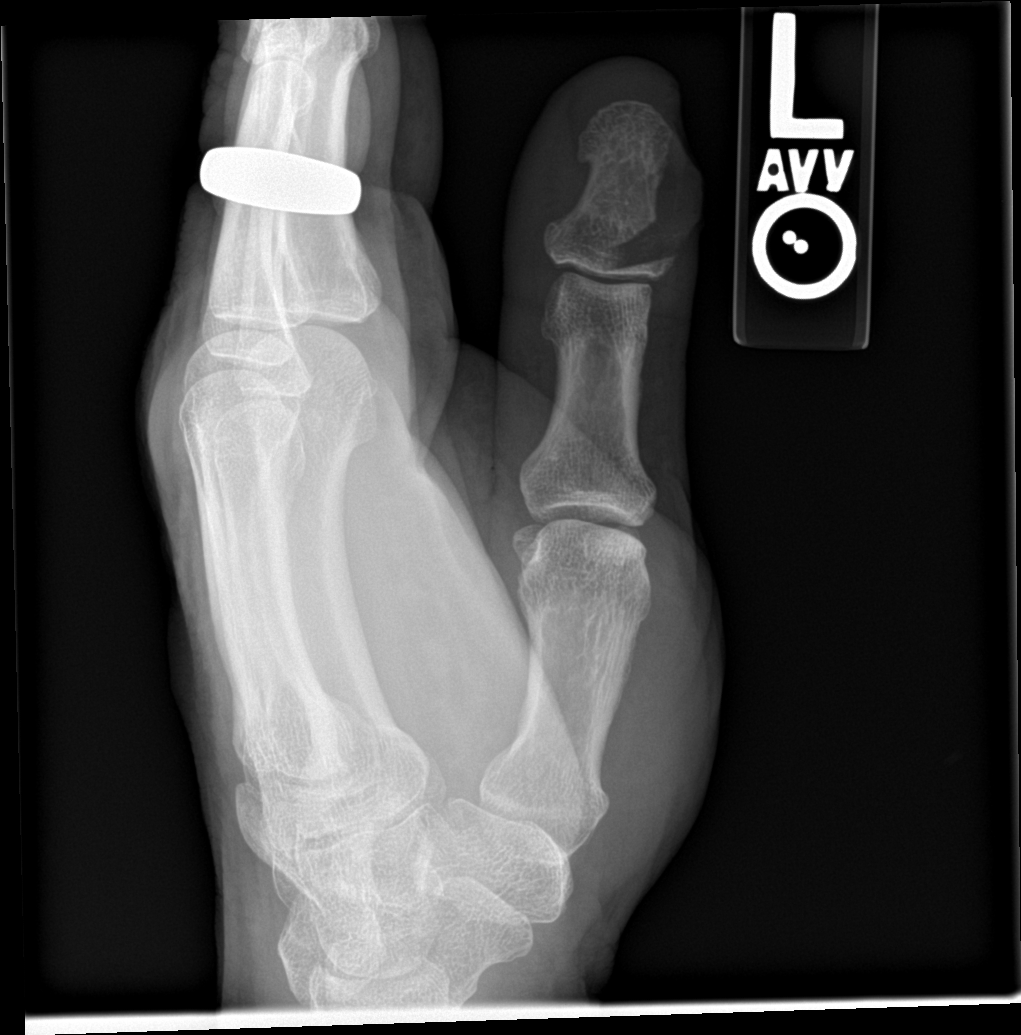

[3 of 3 positions shown; findings below may reference images not displayed]

FINDINGS: There is soft tissue laceration and obliquely oriented fracture
through the lateral base of the first distal phalanx. No evidence
for dislocation. Joint spaces well maintained.
IMPRESSION: 1. Soft tissue laceration and obliquely oriented fracture through
the lateral aspect of the first distal phalanx.

## 2023-02-10 DIAGNOSIS — E291 Testicular hypofunction: Secondary | ICD-10-CM | POA: Diagnosis not present

## 2023-02-16 DIAGNOSIS — E785 Hyperlipidemia, unspecified: Secondary | ICD-10-CM | POA: Diagnosis not present

## 2023-02-16 DIAGNOSIS — E291 Testicular hypofunction: Secondary | ICD-10-CM | POA: Diagnosis not present

## 2023-02-16 DIAGNOSIS — E890 Postprocedural hypothyroidism: Secondary | ICD-10-CM | POA: Diagnosis not present

## 2023-02-18 DIAGNOSIS — E291 Testicular hypofunction: Secondary | ICD-10-CM | POA: Diagnosis not present

## 2023-02-23 DIAGNOSIS — H401131 Primary open-angle glaucoma, bilateral, mild stage: Secondary | ICD-10-CM | POA: Diagnosis not present

## 2023-04-26 DIAGNOSIS — K649 Unspecified hemorrhoids: Secondary | ICD-10-CM | POA: Diagnosis not present

## 2023-04-26 DIAGNOSIS — Z83719 Family history of colon polyps, unspecified: Secondary | ICD-10-CM | POA: Diagnosis not present

## 2023-04-26 DIAGNOSIS — D125 Benign neoplasm of sigmoid colon: Secondary | ICD-10-CM | POA: Diagnosis not present

## 2023-04-26 DIAGNOSIS — Z1211 Encounter for screening for malignant neoplasm of colon: Secondary | ICD-10-CM | POA: Diagnosis not present

## 2023-04-26 DIAGNOSIS — D123 Benign neoplasm of transverse colon: Secondary | ICD-10-CM | POA: Diagnosis not present

## 2023-08-02 DIAGNOSIS — Z23 Encounter for immunization: Secondary | ICD-10-CM | POA: Diagnosis not present

## 2023-08-02 DIAGNOSIS — Z1389 Encounter for screening for other disorder: Secondary | ICD-10-CM | POA: Diagnosis not present

## 2023-08-02 DIAGNOSIS — Z0000 Encounter for general adult medical examination without abnormal findings: Secondary | ICD-10-CM | POA: Diagnosis not present

## 2023-08-12 DIAGNOSIS — Z85850 Personal history of malignant neoplasm of thyroid: Secondary | ICD-10-CM | POA: Diagnosis not present

## 2023-08-12 DIAGNOSIS — E890 Postprocedural hypothyroidism: Secondary | ICD-10-CM | POA: Diagnosis not present

## 2023-08-12 DIAGNOSIS — E291 Testicular hypofunction: Secondary | ICD-10-CM | POA: Diagnosis not present

## 2023-08-12 DIAGNOSIS — E785 Hyperlipidemia, unspecified: Secondary | ICD-10-CM | POA: Diagnosis not present

## 2023-08-12 DIAGNOSIS — Z125 Encounter for screening for malignant neoplasm of prostate: Secondary | ICD-10-CM | POA: Diagnosis not present

## 2023-08-19 DIAGNOSIS — E890 Postprocedural hypothyroidism: Secondary | ICD-10-CM | POA: Diagnosis not present

## 2023-08-19 DIAGNOSIS — E785 Hyperlipidemia, unspecified: Secondary | ICD-10-CM | POA: Diagnosis not present

## 2023-08-19 DIAGNOSIS — E291 Testicular hypofunction: Secondary | ICD-10-CM | POA: Diagnosis not present

## 2023-08-19 DIAGNOSIS — Z125 Encounter for screening for malignant neoplasm of prostate: Secondary | ICD-10-CM | POA: Diagnosis not present

## 2023-08-25 DIAGNOSIS — G4719 Other hypersomnia: Secondary | ICD-10-CM | POA: Diagnosis not present

## 2023-09-20 DIAGNOSIS — G4733 Obstructive sleep apnea (adult) (pediatric): Secondary | ICD-10-CM | POA: Diagnosis not present

## 2023-09-21 DIAGNOSIS — G4733 Obstructive sleep apnea (adult) (pediatric): Secondary | ICD-10-CM | POA: Diagnosis not present

## 2023-10-01 DIAGNOSIS — G4733 Obstructive sleep apnea (adult) (pediatric): Secondary | ICD-10-CM | POA: Diagnosis not present

## 2023-10-31 DIAGNOSIS — G4733 Obstructive sleep apnea (adult) (pediatric): Secondary | ICD-10-CM | POA: Diagnosis not present

## 2023-11-22 DIAGNOSIS — H401131 Primary open-angle glaucoma, bilateral, mild stage: Secondary | ICD-10-CM | POA: Diagnosis not present

## 2023-12-01 DIAGNOSIS — G4733 Obstructive sleep apnea (adult) (pediatric): Secondary | ICD-10-CM | POA: Diagnosis not present

## 2024-01-01 DIAGNOSIS — G4733 Obstructive sleep apnea (adult) (pediatric): Secondary | ICD-10-CM | POA: Diagnosis not present

## 2024-01-05 DIAGNOSIS — G4733 Obstructive sleep apnea (adult) (pediatric): Secondary | ICD-10-CM | POA: Diagnosis not present

## 2024-01-17 DIAGNOSIS — G4733 Obstructive sleep apnea (adult) (pediatric): Secondary | ICD-10-CM | POA: Diagnosis not present

## 2024-02-17 DIAGNOSIS — E890 Postprocedural hypothyroidism: Secondary | ICD-10-CM | POA: Diagnosis not present

## 2024-02-17 DIAGNOSIS — Z125 Encounter for screening for malignant neoplasm of prostate: Secondary | ICD-10-CM | POA: Diagnosis not present

## 2024-02-17 DIAGNOSIS — E785 Hyperlipidemia, unspecified: Secondary | ICD-10-CM | POA: Diagnosis not present

## 2024-02-17 DIAGNOSIS — E291 Testicular hypofunction: Secondary | ICD-10-CM | POA: Diagnosis not present

## 2024-06-20 DIAGNOSIS — H401231 Low-tension glaucoma, bilateral, mild stage: Secondary | ICD-10-CM | POA: Diagnosis not present

## 2024-06-20 DIAGNOSIS — H52203 Unspecified astigmatism, bilateral: Secondary | ICD-10-CM | POA: Diagnosis not present

## 2024-06-20 DIAGNOSIS — H5203 Hypermetropia, bilateral: Secondary | ICD-10-CM | POA: Diagnosis not present

## 2024-06-22 DIAGNOSIS — Z125 Encounter for screening for malignant neoplasm of prostate: Secondary | ICD-10-CM | POA: Diagnosis not present

## 2024-06-22 DIAGNOSIS — E890 Postprocedural hypothyroidism: Secondary | ICD-10-CM | POA: Diagnosis not present

## 2024-06-22 DIAGNOSIS — H8110 Benign paroxysmal vertigo, unspecified ear: Secondary | ICD-10-CM | POA: Diagnosis not present

## 2024-06-22 DIAGNOSIS — E291 Testicular hypofunction: Secondary | ICD-10-CM | POA: Diagnosis not present

## 2024-06-27 DIAGNOSIS — E291 Testicular hypofunction: Secondary | ICD-10-CM | POA: Diagnosis not present

## 2024-06-27 DIAGNOSIS — Z125 Encounter for screening for malignant neoplasm of prostate: Secondary | ICD-10-CM | POA: Diagnosis not present

## 2024-06-27 DIAGNOSIS — E890 Postprocedural hypothyroidism: Secondary | ICD-10-CM | POA: Diagnosis not present

## 2024-06-27 DIAGNOSIS — Z85850 Personal history of malignant neoplasm of thyroid: Secondary | ICD-10-CM | POA: Diagnosis not present

## 2024-07-04 DIAGNOSIS — G4733 Obstructive sleep apnea (adult) (pediatric): Secondary | ICD-10-CM | POA: Diagnosis not present

## 2024-07-11 ENCOUNTER — Other Ambulatory Visit: Payer: Self-pay

## 2024-07-11 ENCOUNTER — Encounter: Payer: Self-pay | Admitting: Physical Therapy

## 2024-07-11 ENCOUNTER — Ambulatory Visit: Attending: Internal Medicine | Admitting: Physical Therapy

## 2024-07-11 DIAGNOSIS — R42 Dizziness and giddiness: Secondary | ICD-10-CM | POA: Diagnosis not present

## 2024-07-11 NOTE — Therapy (Incomplete)
 OUTPATIENT PHYSICAL THERAPY VESTIBULAR EVALUATION     Patient Name: Douglas [name] MRN: [id number] DOB:August 14, 1961, 63 y.o., male Today's Date: 07/12/2024  END OF SESSION:  PT End of Session - 07/12/24 0811     Visit Number 1    Number of Visits 9    Date for PT Re-Evaluation 08/11/24    Authorization Type BC[employer]    PT Start Time 1535    PT Stop Time 1620    PT Time Calculation (min) 45 min    Activity Tolerance Patient tolerated treatment well    Behavior During Therapy [facility] for tasks assessed/performed          Past Medical History:  Diagnosis Date   Cancer (HCC)    08-23-14 Cancer right thyroid  lobe   Collapsed lung 1997   from accident-right lung   Dupuytren's contracture of right hand    09-05-14 remains an issue   GERD (gastroesophageal reflux disease)    Oral mass    09-05-14 small right lower gumline(had for may years   Past Surgical History:  Procedure Laterality Date   implanted bone stimulator  2007   spinal cord stimulator remains implanted left buttocks 09-05-14   KNEE ARTHROSCOPY Right 1985   LUMBAR FUSION  2011   L3-L4- retained hardware   MOHS SURGERY Left 2013   shoulder   THYROID  LOBECTOMY Right 08/23/2014   Procedure: RIGHT THYROID  LOBECTOMY;  Surgeon: Krystal Spinner, MD;  Location: [facility];  Service: General;  Laterality: Right;   THYROIDECTOMY N/A 09/11/2014   Procedure: COMPLETION THYROIDECTOMY;  Surgeon: Krystal Spinner, MD;  Location: [facility];  Service: General;  Laterality: N/A;   Patient Active Problem List   Diagnosis Date Noted   Papillary thyroid  carcinoma, follicular variant 09/11/2014    PCP: [facility] [place] REFERRING PROVIDER: Faythe Purchase, MD   REFERRING DIAG: H81.10 (ICD-10-CM) - Benign paroxysmal vertigo, unspecified ear   THERAPY DIAG:  Dizziness and giddiness  ONSET DATE: 06/27/2024 (MD referral)  Rationale for Evaluation and Treatment: Rehabilitation  SUBJECTIVE:   SUBJECTIVE  STATEMENT: Vertigo starts in the morning-would sit up in the bed and just spinning around.  Sometimes I will walk around and look down or turn too quickly will bring on the dizziness (not as bad as when I lie down).  Just started on CPAP in the past year. Pt accompanied by: self  PERTINENT HISTORY: hx of back surgery (L3-4), hx of neck injury (in high school); hx of thyroid  cancer 2015 and radiation  PAIN:  Are you having pain? No  PRECAUTIONS: None  RED FLAGS: None   WEIGHT BEARING RESTRICTIONS: No  FALLS: Has patient fallen in last 6 months? Yes. Number of falls 1-2 Reports one fall while standing at EOB and just went down  LIVING ENVIRONMENT: Lives with: lives with their family Lives in: House/apartment Stairs: 2-3 steps to enter Has following equipment at home: None  PLOF: Independent and Leisure: plays golf, enjoys Presenter, broadcasting, work is Animator based  PATIENT GOALS: To get rid of the dizziness  OBJECTIVE:  Note: Objective measures were completed at Evaluation unless otherwise noted.  DIAGNOSTIC FINDINGS: NA for this episode  COGNITION: Overall cognitive status: Within functional limits for tasks assessed   SENSATION: No reports of neuropathy or sensation changes Not tested  Cervical ROM:    Active A/PROM (deg) eval  Flexion 45  Extension 30  Right lateral flexion   Left lateral flexion   Right rotation 45  Left rotation 50  (  Blank rows = not tested)   BED MOBILITY:  Independent; guarded; pt does report log roll technique typically, due to hx of back surgery  TRANSFERS: Assistive device utilized: None  Sit to stand: Modified independence Stand to sit: Modified independence  GAIT: Gait pattern: WFL Distance walked: clinic distances Assistive device utilized: None Level of assistance: Complete Independence Comments: Did not assess dynamic gait/balance    PATIENT SURVEYS:  DHI: THE DIZZINESS HANDICAP INVENTORY (DHI)  P1. Does looking up increase  your problem? 0 = No  E2. Because of your problem, do you feel frustrated? 0 = No  F3. Because of your problem, do you restrict your travel for business or recreation?  0 = No  P4. Does walking down the aisle of a supermarket increase your problems?  0 = No  F5. Because of your problem, do you have difficulty getting into or out of bed?  2 = Sometimes  F6. Does your problem significantly restrict your participation in social activities, such as going out to dinner, going to the movies, dancing, or going to parties? 0 = No  F7. Because of your problem, do you have difficulty reading?  0 = No  P8. Does performing more ambitious activities such as sports, dancing, household chores (sweeping or putting dishes away) increase your problems?  0 = No  E9. Because of your problem, are you afraid to leave your home without having without having someone accompany you?  0 = No  E10. Because of your problem have you been embarrassed in front of others?  0 = No  P11. Do quick movements of your head increase your problem?  2 = Sometimes  F12. Because of your problem, do you avoid heights?  0 = No  P13. Does turning over in bed increase your problem?  2 = Sometimes  F14. Because of your problem, is it difficult for you to do strenuous homework or yard work? 0 = No  E15. Because of your problem, are you afraid people may think you are intoxicated? 0 = No  F16. Because of your problem, is it difficult for you to go for a walk by yourself?  0 = No  P17. Does walking down a sidewalk increase your problem?  0 = No  E18.Because of your problem, is it difficult for you to concentrate 0 = No  F19. Because of your problem, is it difficult for you to walk around your house in the dark? 0 = No  E20. Because of your problem, are you afraid to stay home alone?  0 = No  E21. Because of your problem, do you feel handicapped? 0 = No  E22. Has the problem placed stress on your relationships with members of your family or  friends? 0 = No  E23. Because of your problem, are you depressed?  0 = No  F24. Does your problem interfere with your job or household responsibilities?  0 = No  P25. Does bending over increase your problem?  2 = Sometimes  TOTAL 8    DHI Scoring Instructions  The patient is asked to answer each question as it pertains to dizziness or unsteadiness problems, specifically  considering their condition during the last month. Questions are designed to incorporate functional (F), physical  (P), and emotional (E) impacts on disability.   Scores greater than 10 points should be referred to balance specialists for further evaluation.   16-34 Points (mild handicap)  36-52 Points (moderate handicap)  54+ Points (severe  handicap)  Minimally Detectable Change: 17 points [facility] [place], 1990)  Sible[place], G. SHAUNNA. and [place], C. W. (1990). The development of the Dizziness Handicap Inventory. Archives of Otolaryngology - Head and Neck Surgery 116(4): [id number].    VESTIBULAR ASSESSMENT:  GENERAL OBSERVATION: Pt appears guarded and reports being nervous; he does become tearful when recounting his fall; he is sweating (he reports normal for him) during testing   SYMPTOM BEHAVIOR:  Subjective history: Denies head injury, viral illness or antibiotics, no vision/hearing changes  Non-Vestibular symptoms: tinnitus  Type of dizziness: Imbalance (Disequilibrium) and Spinning/Vertigo  Frequency: 3-4x/wk  Duration: < 1 minute  Aggravating factors: Induced by position change: lying supine and supine to sit, Induced by motion: looking up at the ceiling, bending down to the ground, turning body quickly, and turning head quickly, and Worse in the morning  Relieving factors: closing eyes, rest, and slow movements  Progression of symptoms: unchanged  OCULOMOTOR EXAM:  Ocular Alignment: normal  Ocular ROM: No Limitations  Spontaneous Nystagmus: absent  Gaze-Induced Nystagmus: absent  Smooth Pursuits:  intact  Saccades: intact  Convergence/Divergence: 3 cm    VESTIBULAR - OCULAR REFLEX:   Slow VOR: Comment: horizontal brings on mild swimmy headed symptoms; no symptoms vertical  VOR Cancellation: Normal  Head-Impulse Test: HIT Right: negative HIT Left: negative  Dynamic Visual Acuity: NT   POSITIONAL TESTING: Right Dix-Hallpike: no nystagmus Left Dix-Hallpike: no nystagmus Right Roll Test: no nystagmus Left Roll Test: no nystagmus reports feels a little something briefly with initial roll Sidelying test L:  Negative nystagmus, no symptoms  MOTION SENSITIVITY:NOT tested  Motion Sensitivity Quotient Intensity: 0 = none, 1 = Lightheaded, 2 = Mild, 3 = Moderate, 4 = Severe, 5 = Vomiting  Intensity  1. Sitting to supine   2. Supine to L side   3. Supine to R side   4. Supine to sitting   5. L Hallpike-Dix   6. Up from L    7. R Hallpike-Dix   8. Up from R    9. Sitting, head tipped to L knee   10. Head up from L knee   11. Sitting, head tipped to R knee   12. Head up from R knee   13. Sitting head turns x5   14.Sitting head nods x5   15. In stance, 180 turn to L    16. In stance, 180 turn to R      FUNCTIONAL GAIT: MCTSIB   M-CTSIB  Condition 1: Firm Surface, EO 30 Sec, Normal Sway  Condition 2: Firm Surface, EC 30 Sec, Mild Sway  Condition 3: Foam Surface, EO 30 Sec, Normal Sway  Condition 4: Foam Surface, EC 30 Sec, Mild Sway                                                                                                                              TREATMENT DATE: 07/11/2024    PATIENT EDUCATION: Education details:  Eval results, POC, plans to retest for BPPV next visit as BPPV testing is negative today Person educated: Patient Education method: Explanation Education comprehension: verbalized understanding  HOME EXERCISE PROGRAM:  GOALS: Goals reviewed with patient? Yes  SHORT TERM GOALS: = LTGs  LONG TERM GOALS: Target date: 08/11/2024  Pt will be  independent with HEP for improved dizziness, balance. Baseline:  Goal status: INITIAL  2.  Pt will report 0/10 dizziness with bed mobility. Baseline:  Goal status: INITIAL  3.  FGA score to be at least 23/30 to indicate decreased fall risk. Baseline:  Goal status: INITIAL  4.  DHI score to improve by 50% for improved dizziness causing less impact on daily activities. Baseline: 8 Goal status: INITIAL   ASSESSMENT:  CLINICAL IMPRESSION: Patient is a 63 y.o. male who was seen today for physical therapy evaluation and treatment for BPPV.  Pt reports several month hx of spinning sensation upon coming up to sit from lying down position in the morning, as well as occasional dizziness with quick head motions and bending over.  Oculomotor testing is unremarkable today; he does report increased swimmyheaded feeling with VOR x 1 horizontal motions.   With positional testing for BPPV, he does not have any noted nystagmus or symptoms; no noted dizziness or motion sensitivity with return to sit position from lying down.  Pt would benefit from additional visits of skilled PT to retest for BPPV and to provide exercises for habituation, VOR and further motion sensitivity assessment.  OBJECTIVE IMPAIRMENTS: decreased balance and dizziness.   ACTIVITY LIMITATIONS: bending, sleeping, and bed mobility  PARTICIPATION LIMITATIONS: community activity  PERSONAL FACTORS: 3+ comorbidities: see above are also affecting patient's functional outcome.   REHAB POTENTIAL: Good  CLINICAL DECISION MAKING: Stable/uncomplicated  EVALUATION COMPLEXITY: Low   PLAN:  PT FREQUENCY: 1-2x/week  PT DURATION: 4 weeks plus eval  PLANNED INTERVENTIONS: 97750- Physical Performance Testing, 97110-Therapeutic exercises, 97530- Therapeutic activity, [id number]- Neuromuscular re-education, 97535- Self Care, 04007- Canalith repositioning, Patient/Family education, Balance training, and Vestibular training  PLAN FOR NEXT  SESSION: Reassess BPPV and treat as appropriate, initiate HEP for VOR exercises and Brandt-Daroff if appropriate.  Assess visual acuity and motion sensitivity/FGA   STARLET GREIG ORN., PT 07/12/2024, 8:15 AM   [facility] at [facility] [address], [address] [place], [place] [address] Phone # [phone] Fax # [phone]  Referring diagnosis? H81.10 (ICD-10-CM) - Benign paroxysmal vertigo, unspecified ear  Treatment diagnosis? (if different than referring diagnosis)  What was this (referring dx) caused by? R42 []  Surgery []  Fall [x]  Ongoing issue []  Arthritis []  Other: ____________  Laterality: []  Rt []  Lt [x]  Both  Check all possible CPT codes:  *CHOOSE 10 OR LESS*    See Planned Interventions listed in the Plan section of the Evaluation.

## 2024-07-14 ENCOUNTER — Encounter: Payer: Self-pay | Admitting: Physical Therapy

## 2024-07-14 ENCOUNTER — Ambulatory Visit: Admitting: Physical Therapy

## 2024-07-14 DIAGNOSIS — R42 Dizziness and giddiness: Secondary | ICD-10-CM | POA: Diagnosis not present

## 2024-07-14 NOTE — Therapy (Signed)
 OUTPATIENT PHYSICAL THERAPY VESTIBULAR TREATMENT     Patient Name: Douglas [name] MRN: [id number] DOB:December 26, 1960, 63 y.o., male Today's Date: 07/14/2024  END OF SESSION:  PT End of Session - 07/14/24 0851     Visit Number 2    Number of Visits 9    Date for PT Re-Evaluation 08/11/24    Authorization Type BCBS    PT Start Time 0849    PT Stop Time 0924    PT Time Calculation (min) 35 min    Activity Tolerance Patient tolerated treatment well    Behavior During Therapy [facility] for tasks assessed/performed           Past Medical History:  Diagnosis Date   Cancer (HCC)    08-23-14 Cancer right thyroid  lobe   Collapsed lung 1997   from accident-right lung   Dupuytren's contracture of right hand    09-05-14 remains an issue   GERD (gastroesophageal reflux disease)    Oral mass    09-05-14 small right lower gumline(had for may years   Past Surgical History:  Procedure Laterality Date   implanted bone stimulator  2007   spinal cord stimulator remains implanted left buttocks 09-05-14   KNEE ARTHROSCOPY Right 1985   LUMBAR FUSION  2011   L3-L4- retained hardware   MOHS SURGERY Left 2013   shoulder   THYROID  LOBECTOMY Right 08/23/2014   Procedure: RIGHT THYROID  LOBECTOMY;  Surgeon: Krystal Spinner, MD;  Location: [facility];  Service: General;  Laterality: Right;   THYROIDECTOMY N/A 09/11/2014   Procedure: COMPLETION THYROIDECTOMY;  Surgeon: Krystal Spinner, MD;  Location: [facility];  Service: General;  Laterality: N/A;   Patient Active Problem List   Diagnosis Date Noted   Papillary thyroid  carcinoma, follicular variant 09/11/2014    PCP: [facility] [place] REFERRING PROVIDER: Faythe Purchase, MD   REFERRING DIAG: H81.10 (ICD-10-CM) - Benign paroxysmal vertigo, unspecified ear   THERAPY DIAG:  Dizziness and giddiness  ONSET DATE: 06/27/2024 (MD referral)  Rationale for Evaluation and Treatment: Rehabilitation  SUBJECTIVE:   SUBJECTIVE  STATEMENT: Feeling better, no dizziness with bed mobility since eval.  Slight dizziness with quick head turn today.  No symptoms at beginning of session. Pt accompanied by: self  PERTINENT HISTORY: hx of back surgery (L3-4), hx of neck injury (in high school); hx of thyroid  cancer 2015 and radiation  PAIN:  Are you having pain? No  PRECAUTIONS: None  RED FLAGS: None   WEIGHT BEARING RESTRICTIONS: No  FALLS: Has patient fallen in last 6 months? Yes. Number of falls 1-2 Reports one fall while standing at EOB and just went down  LIVING ENVIRONMENT: Lives with: lives with their family Lives in: House/apartment Stairs: 2-3 steps to enter Has following equipment at home: None  PLOF: Independent and Leisure: plays golf, enjoys Presenter, broadcasting, work is Animator based  PATIENT GOALS: To get rid of the dizziness  OBJECTIVE:    TODAY'S TREATMENT: 07/14/2024 Activity Comments  R DH Latent, brief nystagmus  L DH No nystagmus, no symptoms Mild unsteadiness upon sitting up  R Epley  Very brief nystagmus, tolerates well, feels mild dizziness position 3  R DH Pt feels mild dizziness, briefly, no nystagmus  R Epley, 2nd time Tolerates well, mild dizziness upon sitting EOM      Access Code: [email or web address] URL: [email or web address] Date: 07/14/2024 Prepared by: [facility] - Outpatient  Rehab - [facility]  Exercises - Brandt-Daroff Vestibular Exercise  - 2 x daily - 7 x weekly -  1 sets - 3 reps - Seated Gaze Stabilization with Head Rotation  - 2 x daily - 7 x weekly - 1 sets - 3 reps - 30 sec hold  PATIENT EDUCATION: Education details: BPPV findings, post CRM instructions and HEP initiated Person educated: Patient Education method: Explanation, Demonstration, Verbal cues, and Handouts Education comprehension: verbalized understanding  ------------------------------------------------------- Note: Objective measures were completed at Evaluation unless otherwise  noted.  DIAGNOSTIC FINDINGS: NA for this episode  COGNITION: Overall cognitive status: Within functional limits for tasks assessed   SENSATION: No reports of neuropathy or sensation changes Not tested  Cervical ROM:    Active A/PROM (deg) eval  Flexion 45  Extension 30  Right lateral flexion   Left lateral flexion   Right rotation 45  Left rotation 50  (Blank rows = not tested)   BED MOBILITY:  Independent; guarded; pt does report log roll technique typically, due to hx of back surgery  TRANSFERS: Assistive device utilized: None  Sit to stand: Modified independence Stand to sit: Modified independence  GAIT: Gait pattern: WFL Distance walked: clinic distances Assistive device utilized: None Level of assistance: Complete Independence Comments: Did not assess dynamic gait/balance    PATIENT SURVEYS:  DHI: THE DIZZINESS HANDICAP INVENTORY (DHI)  P1. Does looking up increase your problem? 0 = No  E2. Because of your problem, do you feel frustrated? 0 = No  F3. Because of your problem, do you restrict your travel for business or recreation?  0 = No  P4. Does walking down the aisle of a supermarket increase your problems?  0 = No  F5. Because of your problem, do you have difficulty getting into or out of bed?  2 = Sometimes  F6. Does your problem significantly restrict your participation in social activities, such as going out to dinner, going to the movies, dancing, or going to parties? 0 = No  F7. Because of your problem, do you have difficulty reading?  0 = No  P8. Does performing more ambitious activities such as sports, dancing, household chores (sweeping or putting dishes away) increase your problems?  0 = No  E9. Because of your problem, are you afraid to leave your home without having without having someone accompany you?  0 = No  E10. Because of your problem have you been embarrassed in front of others?  0 = No  P11. Do quick movements of your head increase your  problem?  2 = Sometimes  F12. Because of your problem, do you avoid heights?  0 = No  P13. Does turning over in bed increase your problem?  2 = Sometimes  F14. Because of your problem, is it difficult for you to do strenuous homework or yard work? 0 = No  E15. Because of your problem, are you afraid people may think you are intoxicated? 0 = No  F16. Because of your problem, is it difficult for you to go for a walk by yourself?  0 = No  P17. Does walking down a sidewalk increase your problem?  0 = No  E18.Because of your problem, is it difficult for you to concentrate 0 = No  F19. Because of your problem, is it difficult for you to walk around your house in the dark? 0 = No  E20. Because of your problem, are you afraid to stay home alone?  0 = No  E21. Because of your problem, do you feel handicapped? 0 = No  E22. Has the problem placed stress  on your relationships with members of your family or friends? 0 = No  E23. Because of your problem, are you depressed?  0 = No  F24. Does your problem interfere with your job or household responsibilities?  0 = No  P25. Does bending over increase your problem?  2 = Sometimes  TOTAL 8    DHI Scoring Instructions  The patient is asked to answer each question as it pertains to dizziness or unsteadiness problems, specifically  considering their condition during the last month. Questions are designed to incorporate functional (F), physical  (P), and emotional (E) impacts on disability.   Scores greater than 10 points should be referred to balance specialists for further evaluation.   16-34 Points (mild handicap)  36-52 Points (moderate handicap)  54+ Points (severe handicap)  Minimally Detectable Change: 17 points ([address] [place], 1990)  [place], G. SHAUNNA. and [place], C. W. (1990). The development of the Dizziness Handicap Inventory. Archives of Otolaryngology - Head and Neck Surgery 116(4): [id number].    VESTIBULAR ASSESSMENT:  GENERAL  OBSERVATION: Pt appears guarded and reports being nervous; he does become tearful when recounting his fall; he is sweating (he reports normal for him) during testing   SYMPTOM BEHAVIOR:  Subjective history: Denies head injury, viral illness or antibiotics, no vision/hearing changes  Non-Vestibular symptoms: tinnitus  Type of dizziness: Imbalance (Disequilibrium) and Spinning/Vertigo  Frequency: 3-4x/wk  Duration: < 1 minute  Aggravating factors: Induced by position change: lying supine and supine to sit, Induced by motion: looking up at the ceiling, bending down to the ground, turning body quickly, and turning head quickly, and Worse in the morning  Relieving factors: closing eyes, rest, and slow movements  Progression of symptoms: unchanged  OCULOMOTOR EXAM:  Ocular Alignment: normal  Ocular ROM: No Limitations  Spontaneous Nystagmus: absent  Gaze-Induced Nystagmus: absent  Smooth Pursuits: intact  Saccades: intact  Convergence/Divergence: 3 cm    VESTIBULAR - OCULAR REFLEX:   Slow VOR: Comment: horizontal brings on mild swimmy headed symptoms; no symptoms vertical  VOR Cancellation: Normal  Head-Impulse Test: HIT Right: negative HIT Left: negative  Dynamic Visual Acuity: NT   POSITIONAL TESTING: Right Dix-Hallpike: no nystagmus Left Dix-Hallpike: no nystagmus Right Roll Test: no nystagmus Left Roll Test: no nystagmus reports feels a little something briefly with initial roll Sidelying test L:  Negative nystagmus, no symptoms  MOTION SENSITIVITY:NOT tested  Motion Sensitivity Quotient Intensity: 0 = none, 1 = Lightheaded, 2 = Mild, 3 = Moderate, 4 = Severe, 5 = Vomiting  Intensity  1. Sitting to supine   2. Supine to L side   3. Supine to R side   4. Supine to sitting   5. L Hallpike-Dix   6. Up from L    7. R Hallpike-Dix   8. Up from R    9. Sitting, head tipped to L knee   10. Head up from L knee   11. Sitting, head tipped to R knee   12. Head up from R knee    13. Sitting head turns x5   14.Sitting head nods x5   15. In stance, 180 turn to L    16. In stance, 180 turn to R      FUNCTIONAL GAIT: MCTSIB   M-CTSIB  Condition 1: Firm Surface, EO 30 Sec, Normal Sway  Condition 2: Firm Surface, EC 30 Sec, Mild Sway  Condition 3: Foam Surface, EO 30 Sec, Normal Sway  Condition 4: Foam Surface, EC 30 Sec,  Mild Sway                                                                                                                              TREATMENT DATE: 07/11/2024    PATIENT EDUCATION: Education details: Eval results, POC, plans to retest for BPPV next visit as BPPV testing is negative today Person educated: Patient Education method: Explanation Education comprehension: verbalized understanding  HOME EXERCISE PROGRAM:  GOALS: Goals reviewed with patient? Yes  SHORT TERM GOALS: = LTGs  LONG TERM GOALS: Target date: 08/11/2024  Pt will be independent with HEP for improved dizziness, balance. Baseline:  Goal status: INITIAL  2.  Pt will report 0/10 dizziness with bed mobility. Baseline:  Goal status: INITIAL  3.  FGA score to be at least 23/30 to indicate decreased fall risk. Baseline:  Goal status: INITIAL  4.  DHI score to improve by 50% for improved dizziness causing less impact on daily activities. Baseline: 8 Goal status: INITIAL   ASSESSMENT:  CLINICAL IMPRESSION: Pt presents today and reports overall improved symptoms.  With positional testing today, he does have mild dizziness, brief nystagmus in R DH position; treated with Epley x 2.  He notes some mild dizziness upon return to sit, but tolerates positioning well.  Educated in [place] and gaze stabilization exercises, to start tomorrow.  Will plan to reassess BPPV and work on habituation as appropriate to decrease overall reports of dizziness.   Eval:  Patient is a 63 y.o. male who was seen today for physical therapy evaluation and treatment for BPPV.  Pt  reports several month hx of spinning sensation upon coming up to sit from lying down position in the morning, as well as occasional dizziness with quick head motions and bending over.  Oculomotor testing is unremarkable today; he does report increased swimmyheaded feeling with VOR x 1 horizontal motions.   With positional testing for BPPV, he does not have any noted nystagmus or symptoms; no noted dizziness or motion sensitivity with return to sit position from lying down.  Pt would benefit from additional visits of skilled PT to retest for BPPV and to provide exercises for habituation, VOR and further motion sensitivity assessment.  OBJECTIVE IMPAIRMENTS: decreased balance and dizziness.   ACTIVITY LIMITATIONS: bending, sleeping, and bed mobility  PARTICIPATION LIMITATIONS: community activity  PERSONAL FACTORS: 3+ comorbidities: see above are also affecting patient's functional outcome.   REHAB POTENTIAL: Good  CLINICAL DECISION MAKING: Stable/uncomplicated  EVALUATION COMPLEXITY: Low   PLAN:  PT FREQUENCY: 1-2x/week  PT DURATION: 4 weeks plus eval  PLANNED INTERVENTIONS: 97750- Physical Performance Testing, 97110-Therapeutic exercises, 97530- Therapeutic activity, [id number]- Neuromuscular re-education, 97535- Self Care, 04007- Canalith repositioning, Patient/Family education, Balance training, and Vestibular training  PLAN FOR NEXT SESSION: Reassess BPPV and treat as appropriate.  Review HEP for VOR exercises and Brandt-Daroff.  Assess visual acuity and motion sensitivity/FGA   Harlei Lehrmann W., PT 07/14/2024, 9:27 AM   [facility]  at [facility] [address] [place], [place] [address] Phone # [phone] Fax # [phone]  Referring diagnosis? H81.10 (ICD-10-CM) - Benign paroxysmal vertigo, unspecified ear  Treatment diagnosis? (if different than referring diagnosis)  What was this (referring dx) caused by? R42 []  Surgery []  Fall [x]   Ongoing issue []  Arthritis []  Other: ____________  Laterality: []  Rt []  Lt [x]  Both  Check all possible CPT codes:  *CHOOSE 10 OR LESS*    See Planned Interventions listed in the Plan section of the Evaluation.

## 2024-07-18 ENCOUNTER — Encounter: Payer: Self-pay | Admitting: Physical Therapy

## 2024-07-18 ENCOUNTER — Ambulatory Visit: Payer: Self-pay | Admitting: Physical Therapy

## 2024-07-18 DIAGNOSIS — R42 Dizziness and giddiness: Secondary | ICD-10-CM

## 2024-07-18 NOTE — Therapy (Signed)
 OUTPATIENT PHYSICAL THERAPY VESTIBULAR TREATMENT     Patient Name: Douglas [name] MRN: [id number] DOB:1961/09/04, 63 y.o., male Today's Date: 07/18/2024  END OF SESSION:  PT End of Session - 07/18/24 1107     Visit Number 3    Number of Visits 9    Date for PT Re-Evaluation 08/11/24    Authorization Type BCBS    PT Start Time 1105    PT Stop Time 1124    PT Time Calculation (min) 19 min    Activity Tolerance Patient tolerated treatment well    Behavior During Therapy [facility] for tasks assessed/performed            Past Medical History:  Diagnosis Date   Cancer (HCC)    08-23-14 Cancer right thyroid  lobe   Collapsed lung 1997   from accident-right lung   Dupuytren's contracture of right hand    09-05-14 remains an issue   GERD (gastroesophageal reflux disease)    Oral mass    09-05-14 small right lower gumline(had for may years   Past Surgical History:  Procedure Laterality Date   implanted bone stimulator  2007   spinal cord stimulator remains implanted left buttocks 09-05-14   KNEE ARTHROSCOPY Right 1985   LUMBAR FUSION  2011   L3-L4- retained hardware   MOHS SURGERY Left 2013   shoulder   THYROID  LOBECTOMY Right 08/23/2014   Procedure: RIGHT THYROID  LOBECTOMY;  Surgeon: Krystal Spinner, MD;  Location: [facility];  Service: General;  Laterality: Right;   THYROIDECTOMY N/A 09/11/2014   Procedure: COMPLETION THYROIDECTOMY;  Surgeon: Krystal Spinner, MD;  Location: [facility];  Service: General;  Laterality: N/A;   Patient Active Problem List   Diagnosis Date Noted   Papillary thyroid  carcinoma, follicular variant 09/11/2014    PCP: [facility] [place] REFERRING PROVIDER: Faythe Purchase, MD   REFERRING DIAG: H81.10 (ICD-10-CM) - Benign paroxysmal vertigo, unspecified ear   THERAPY DIAG:  Dizziness and giddiness  ONSET DATE: 06/27/2024 (MD referral)  Rationale for Evaluation and Treatment: Rehabilitation  SUBJECTIVE:   SUBJECTIVE  STATEMENT: No dizziness episodes. Pt accompanied by: self  PERTINENT HISTORY: hx of back surgery (L3-4), hx of neck injury (in high school); hx of thyroid  cancer 2015 and radiation  PAIN:  Are you having pain? No  PRECAUTIONS: None  RED FLAGS: None   WEIGHT BEARING RESTRICTIONS: No  FALLS: Has patient fallen in last 6 months? Yes. Number of falls 1-2 Reports one fall while standing at EOB and just went down  LIVING ENVIRONMENT: Lives with: lives with their family Lives in: House/apartment Stairs: 2-3 steps to enter Has following equipment at home: None  PLOF: Independent and Leisure: plays golf, enjoys Presenter, broadcasting, work is Animator based  PATIENT GOALS: To get rid of the dizziness  OBJECTIVE:    TODAY'S TREATMENT: 07/18/2024 Activity Comments  FGA 26/30 Decreased fall risk  Reviewed HEP Return/verbalize demo, no symptoms  DHI               Access Code: [email or web address] URL: [email or web address] Date: 07/14/2024 Prepared by: [facility] - Outpatient  Rehab - [facility]  Exercises - Brandt-Daroff Vestibular Exercise  - 2 x daily - 7 x weekly - 1 sets - 3 reps - Seated Gaze Stabilization with Head Rotation  - 2 x daily - 7 x weekly - 1 sets - 3 reps - 30 sec hold  PATIENT EDUCATION: Education details: Progress towards goals, POC, plans to hold chart open, but no further appts  for 1 month, unless pt needs to return for additional dizziness episodes Person educated: Patient Education method: Explanation Education comprehension: verbalized understanding  ------------------------------------------------------- Note: Objective measures were completed at Evaluation unless otherwise noted.  DIAGNOSTIC FINDINGS: NA for this episode  COGNITION: Overall cognitive status: Within functional limits for tasks assessed   SENSATION: No reports of neuropathy or sensation changes Not tested  Cervical ROM:    Active A/PROM (deg) eval  Flexion 45  Extension 30   Right lateral flexion   Left lateral flexion   Right rotation 45  Left rotation 50  (Blank rows = not tested)   BED MOBILITY:  Independent; guarded; pt does report log roll technique typically, due to hx of back surgery  TRANSFERS: Assistive device utilized: None  Sit to stand: Modified independence Stand to sit: Modified independence  GAIT: Gait pattern: WFL Distance walked: clinic distances Assistive device utilized: None Level of assistance: Complete Independence Comments: Did not assess dynamic gait/balance    PATIENT SURVEYS:  DHI: THE DIZZINESS HANDICAP INVENTORY (DHI)  P1. Does looking up increase your problem? 0 = No  E2. Because of your problem, do you feel frustrated? 0 = No  F3. Because of your problem, do you restrict your travel for business or recreation?  0 = No  P4. Does walking down the aisle of a supermarket increase your problems?  0 = No  F5. Because of your problem, do you have difficulty getting into or out of bed?  2 = Sometimes  F6. Does your problem significantly restrict your participation in social activities, such as going out to dinner, going to the movies, dancing, or going to parties? 0 = No  F7. Because of your problem, do you have difficulty reading?  0 = No  P8. Does performing more ambitious activities such as sports, dancing, household chores (sweeping or putting dishes away) increase your problems?  0 = No  E9. Because of your problem, are you afraid to leave your home without having without having someone accompany you?  0 = No  E10. Because of your problem have you been embarrassed in front of others?  0 = No  P11. Do quick movements of your head increase your problem?  2 = Sometimes  F12. Because of your problem, do you avoid heights?  0 = No  P13. Does turning over in bed increase your problem?  2 = Sometimes  F14. Because of your problem, is it difficult for you to do strenuous homework or yard work? 0 = No  E15. Because of your  problem, are you afraid people may think you are intoxicated? 0 = No  F16. Because of your problem, is it difficult for you to go for a walk by yourself?  0 = No  P17. Does walking down a sidewalk increase your problem?  0 = No  E18.Because of your problem, is it difficult for you to concentrate 0 = No  F19. Because of your problem, is it difficult for you to walk around your house in the dark? 0 = No  E20. Because of your problem, are you afraid to stay home alone?  0 = No  E21. Because of your problem, do you feel handicapped? 0 = No  E22. Has the problem placed stress on your relationships with members of your family or friends? 0 = No  E23. Because of your problem, are you depressed?  0 = No  F24. Does your problem interfere with your job or household  responsibilities?  0 = No  P25. Does bending over increase your problem?  2 = Sometimes  TOTAL 8    DHI Scoring Instructions  The patient is asked to answer each question as it pertains to dizziness or unsteadiness problems, specifically  considering their condition during the last month. Questions are designed to incorporate functional (F), physical  (P), and emotional (E) impacts on disability.   Scores greater than 10 points should be referred to balance specialists for further evaluation.   16-34 Points (mild handicap)  36-52 Points (moderate handicap)  54+ Points (severe handicap)  Minimally Detectable Change: 17 points ([address] [place], 1990)  [place], G. SHAUNNA. and [place], C. W. (1990). The development of the Dizziness Handicap Inventory. Archives of Otolaryngology - Head and Neck Surgery 116(4): [id number].    VESTIBULAR ASSESSMENT:  GENERAL OBSERVATION: Pt appears guarded and reports being nervous; he does become tearful when recounting his fall; he is sweating (he reports normal for him) during testing   SYMPTOM BEHAVIOR:  Subjective history: Denies head injury, viral illness or antibiotics, no vision/hearing  changes  Non-Vestibular symptoms: tinnitus  Type of dizziness: Imbalance (Disequilibrium) and Spinning/Vertigo  Frequency: 3-4x/wk  Duration: < 1 minute  Aggravating factors: Induced by position change: lying supine and supine to sit, Induced by motion: looking up at the ceiling, bending down to the ground, turning body quickly, and turning head quickly, and Worse in the morning  Relieving factors: closing eyes, rest, and slow movements  Progression of symptoms: unchanged  OCULOMOTOR EXAM:  Ocular Alignment: normal  Ocular ROM: No Limitations  Spontaneous Nystagmus: absent  Gaze-Induced Nystagmus: absent  Smooth Pursuits: intact  Saccades: intact  Convergence/Divergence: 3 cm    VESTIBULAR - OCULAR REFLEX:   Slow VOR: Comment: horizontal brings on mild swimmy headed symptoms; no symptoms vertical  VOR Cancellation: Normal  Head-Impulse Test: HIT Right: negative HIT Left: negative  Dynamic Visual Acuity: NT   POSITIONAL TESTING: Right Dix-Hallpike: no nystagmus Left Dix-Hallpike: no nystagmus Right Roll Test: no nystagmus Left Roll Test: no nystagmus reports feels a little something briefly with initial roll Sidelying test L:  Negative nystagmus, no symptoms  MOTION SENSITIVITY:NOT tested  Motion Sensitivity Quotient Intensity: 0 = none, 1 = Lightheaded, 2 = Mild, 3 = Moderate, 4 = Severe, 5 = Vomiting  Intensity  1. Sitting to supine   2. Supine to L side   3. Supine to R side   4. Supine to sitting   5. L Hallpike-Dix   6. Up from L    7. R Hallpike-Dix   8. Up from R    9. Sitting, head tipped to L knee   10. Head up from L knee   11. Sitting, head tipped to R knee   12. Head up from R knee   13. Sitting head turns x5   14.Sitting head nods x5   15. In stance, 180 turn to L    16. In stance, 180 turn to R      FUNCTIONAL GAIT: MCTSIB   M-CTSIB  Condition 1: Firm Surface, EO 30 Sec, Normal Sway  Condition 2: Firm Surface, EC 30 Sec, Mild Sway   Condition 3: Foam Surface, EO 30 Sec, Normal Sway  Condition 4: Foam Surface, EC 30 Sec, Mild Sway  TREATMENT DATE: 07/11/2024    PATIENT EDUCATION: Education details: Eval results, POC, plans to retest for BPPV next visit as BPPV testing is negative today Person educated: Patient Education method: Explanation Education comprehension: verbalized understanding  HOME EXERCISE PROGRAM:  GOALS: Goals reviewed with patient? Yes  SHORT TERM GOALS: = LTGs  LONG TERM GOALS: Target date: 08/11/2024  Pt will be independent with HEP for improved dizziness, balance. Baseline:  Goal status: MET9/07/2024  2.  Pt will report 0/10 dizziness with bed mobility. Baseline:  Goal status: MET9/07/2024  3.  FGA score to be at least 23/30 to indicate decreased fall risk. Baseline: 26/30 07/18/2024 Goal status: MET9/07/2024  4.  DHI score to improve by 50% for improved dizziness causing less impact on daily activities. Baseline: 8>0 07/18/2024 Goal status: MET9/07/2024   ASSESSMENT:  CLINICAL IMPRESSION: Pt presents today with no reports of dizziness symptoms with bed mobility or any other activities.  Reviewed HEP and pt is reporting no symptoms with his exercises.  Assessed FGA and score of 26/30 indicates decreased fall risk.  DHI score today is 0, improved from 8 at eval. Will plan to hold chart open x 1 month in the event of another bout of vertigo; if not seen by then, will discharge.   Eval:  Patient is a 63 y.o. male who was seen today for physical therapy evaluation and treatment for BPPV.  Pt reports several month hx of spinning sensation upon coming up to sit from lying down position in the morning, as well as occasional dizziness with quick head motions and bending over.  Oculomotor testing is unremarkable today; he does report increased swimmyheaded feeling with VOR x  1 horizontal motions.   With positional testing for BPPV, he does not have any noted nystagmus or symptoms; no noted dizziness or motion sensitivity with return to sit position from lying down.  Pt would benefit from additional visits of skilled PT to retest for BPPV and to provide exercises for habituation, VOR and further motion sensitivity assessment.  OBJECTIVE IMPAIRMENTS: decreased balance and dizziness.   ACTIVITY LIMITATIONS: bending, sleeping, and bed mobility  PARTICIPATION LIMITATIONS: community activity  PERSONAL FACTORS: 3+ comorbidities: see above are also affecting patient's functional outcome.   REHAB POTENTIAL: Good  CLINICAL DECISION MAKING: Stable/uncomplicated  EVALUATION COMPLEXITY: Low   PLAN:  PT FREQUENCY: 1-2x/week  PT DURATION: 4 weeks plus eval  PLANNED INTERVENTIONS: 97750- Physical Performance Testing, 97110-Therapeutic exercises, 97530- Therapeutic activity, 97112- Neuromuscular re-education, 97535- Self Care, 04007- Canalith repositioning, Patient/Family education, Balance training, and Vestibular training  PLAN FOR NEXT SESSION: Hold chart open for appox 1 month in case pt needs to return for additional bout of   Soyla Bainter W., PT 07/18/2024, 11:29 AM   [facility] at [facility] [address] [place], [place] [address] Phone # [phone] Fax # [phone]  Referring diagnosis? H81.10 (ICD-10-CM) - Benign paroxysmal vertigo, unspecified ear  Treatment diagnosis? (if different than referring diagnosis)  What was this (referring dx) caused by? R42 []  Surgery []  Fall [x]  Ongoing issue []  Arthritis []  Other: ____________  Laterality: []  Rt []  Lt [x]  Both  Check all possible CPT codes:  *CHOOSE 10 OR LESS*    See Planned Interventions listed in the Plan section of the Evaluation.

## 2024-08-02 DIAGNOSIS — Z1331 Encounter for screening for depression: Secondary | ICD-10-CM | POA: Diagnosis not present

## 2024-08-02 DIAGNOSIS — N529 Male erectile dysfunction, unspecified: Secondary | ICD-10-CM | POA: Diagnosis not present

## 2024-08-02 DIAGNOSIS — I10 Essential (primary) hypertension: Secondary | ICD-10-CM | POA: Diagnosis not present

## 2024-08-02 DIAGNOSIS — Z0000 Encounter for general adult medical examination without abnormal findings: Secondary | ICD-10-CM | POA: Diagnosis not present

## 2024-08-02 DIAGNOSIS — E782 Mixed hyperlipidemia: Secondary | ICD-10-CM | POA: Diagnosis not present

## 2024-08-02 DIAGNOSIS — R5383 Other fatigue: Secondary | ICD-10-CM | POA: Diagnosis not present

## 2024-08-02 DIAGNOSIS — Z23 Encounter for immunization: Secondary | ICD-10-CM | POA: Diagnosis not present

## 2024-08-28 DIAGNOSIS — I10 Essential (primary) hypertension: Secondary | ICD-10-CM | POA: Diagnosis not present

## 2024-08-28 DIAGNOSIS — E782 Mixed hyperlipidemia: Secondary | ICD-10-CM | POA: Diagnosis not present
